# Patient Record
Sex: Female | Born: 1955 | Race: White | Hispanic: No | Smoking: Former smoker
Health system: Southern US, Community
[De-identification: ages and names within clinical notes are randomized; demographics above are authoritative.]

## PROBLEM LIST (undated history)

## (undated) DIAGNOSIS — J449 Chronic obstructive pulmonary disease, unspecified: Secondary | ICD-10-CM

## (undated) DIAGNOSIS — Z923 Personal history of irradiation: Secondary | ICD-10-CM

## (undated) DIAGNOSIS — C50919 Malignant neoplasm of unspecified site of unspecified female breast: Secondary | ICD-10-CM

## (undated) DIAGNOSIS — Z9221 Personal history of antineoplastic chemotherapy: Secondary | ICD-10-CM

## (undated) DIAGNOSIS — R7303 Prediabetes: Secondary | ICD-10-CM

## (undated) DIAGNOSIS — E039 Hypothyroidism, unspecified: Secondary | ICD-10-CM

## (undated) DIAGNOSIS — I10 Essential (primary) hypertension: Secondary | ICD-10-CM

## (undated) HISTORY — PX: BREAST LUMPECTOMY: SHX2

---

## 2001-05-03 ENCOUNTER — Emergency Department (HOSPITAL_COMMUNITY): Admission: EM | Admit: 2001-05-03 | Discharge: 2001-05-04 | Payer: Self-pay | Admitting: Emergency Medicine

## 2001-05-04 ENCOUNTER — Encounter: Payer: Self-pay | Admitting: Emergency Medicine

## 2005-12-09 DIAGNOSIS — C50919 Malignant neoplasm of unspecified site of unspecified female breast: Secondary | ICD-10-CM

## 2005-12-09 HISTORY — PX: BREAST BIOPSY: SHX20

## 2005-12-09 HISTORY — DX: Malignant neoplasm of unspecified site of unspecified female breast: C50.919

## 2005-12-23 ENCOUNTER — Ambulatory Visit: Payer: Self-pay | Admitting: Obstetrics and Gynecology

## 2005-12-23 ENCOUNTER — Ambulatory Visit: Payer: Self-pay | Admitting: Internal Medicine

## 2006-01-03 ENCOUNTER — Ambulatory Visit: Payer: Self-pay | Admitting: General Surgery

## 2006-01-23 ENCOUNTER — Ambulatory Visit: Payer: Self-pay | Admitting: Internal Medicine

## 2006-01-27 ENCOUNTER — Ambulatory Visit: Payer: Self-pay | Admitting: General Surgery

## 2006-02-06 ENCOUNTER — Ambulatory Visit: Payer: Self-pay | Admitting: Internal Medicine

## 2006-03-09 ENCOUNTER — Ambulatory Visit: Payer: Self-pay | Admitting: Internal Medicine

## 2006-04-08 ENCOUNTER — Ambulatory Visit: Payer: Self-pay | Admitting: Internal Medicine

## 2006-05-09 ENCOUNTER — Ambulatory Visit: Payer: Self-pay | Admitting: Internal Medicine

## 2006-06-08 ENCOUNTER — Ambulatory Visit: Payer: Self-pay | Admitting: Internal Medicine

## 2006-07-09 ENCOUNTER — Ambulatory Visit: Payer: Self-pay | Admitting: Internal Medicine

## 2006-08-09 ENCOUNTER — Ambulatory Visit: Payer: Self-pay | Admitting: Internal Medicine

## 2006-09-08 ENCOUNTER — Ambulatory Visit: Payer: Self-pay | Admitting: Internal Medicine

## 2006-10-09 ENCOUNTER — Ambulatory Visit: Payer: Self-pay | Admitting: Internal Medicine

## 2007-01-13 ENCOUNTER — Ambulatory Visit: Payer: Self-pay | Admitting: Internal Medicine

## 2007-02-07 ENCOUNTER — Ambulatory Visit: Payer: Self-pay | Admitting: Internal Medicine

## 2007-04-09 ENCOUNTER — Ambulatory Visit: Payer: Self-pay | Admitting: Internal Medicine

## 2007-04-13 ENCOUNTER — Ambulatory Visit: Payer: Self-pay | Admitting: Internal Medicine

## 2007-05-10 ENCOUNTER — Ambulatory Visit: Payer: Self-pay | Admitting: Internal Medicine

## 2007-06-09 ENCOUNTER — Ambulatory Visit: Payer: Self-pay | Admitting: Internal Medicine

## 2007-08-10 ENCOUNTER — Ambulatory Visit: Payer: Self-pay | Admitting: Internal Medicine

## 2007-08-12 ENCOUNTER — Ambulatory Visit: Payer: Self-pay | Admitting: Internal Medicine

## 2007-09-09 ENCOUNTER — Ambulatory Visit: Payer: Self-pay | Admitting: Internal Medicine

## 2008-04-06 ENCOUNTER — Ambulatory Visit: Payer: Self-pay | Admitting: Internal Medicine

## 2008-05-09 ENCOUNTER — Ambulatory Visit: Payer: Self-pay | Admitting: Internal Medicine

## 2008-08-19 ENCOUNTER — Emergency Department: Payer: Self-pay | Admitting: Emergency Medicine

## 2008-10-09 ENCOUNTER — Ambulatory Visit: Payer: Self-pay | Admitting: Internal Medicine

## 2008-10-28 ENCOUNTER — Ambulatory Visit: Payer: Self-pay | Admitting: Internal Medicine

## 2008-11-08 ENCOUNTER — Ambulatory Visit: Payer: Self-pay | Admitting: Internal Medicine

## 2009-04-08 ENCOUNTER — Ambulatory Visit: Payer: Self-pay | Admitting: Internal Medicine

## 2009-04-26 ENCOUNTER — Ambulatory Visit: Payer: Self-pay | Admitting: Internal Medicine

## 2009-05-09 ENCOUNTER — Ambulatory Visit: Payer: Self-pay | Admitting: Internal Medicine

## 2009-06-08 ENCOUNTER — Ambulatory Visit: Payer: Self-pay | Admitting: Internal Medicine

## 2009-10-09 ENCOUNTER — Ambulatory Visit: Payer: Self-pay | Admitting: Internal Medicine

## 2009-10-16 ENCOUNTER — Ambulatory Visit: Payer: Self-pay | Admitting: Internal Medicine

## 2009-11-08 ENCOUNTER — Ambulatory Visit: Payer: Self-pay | Admitting: Internal Medicine

## 2010-04-08 ENCOUNTER — Ambulatory Visit: Payer: Self-pay | Admitting: Internal Medicine

## 2010-04-16 ENCOUNTER — Ambulatory Visit: Payer: Self-pay | Admitting: Internal Medicine

## 2010-05-09 ENCOUNTER — Ambulatory Visit: Payer: Self-pay | Admitting: Internal Medicine

## 2010-06-08 ENCOUNTER — Ambulatory Visit: Payer: Self-pay | Admitting: Internal Medicine

## 2010-11-09 ENCOUNTER — Ambulatory Visit: Payer: Self-pay | Admitting: Gastroenterology

## 2010-11-14 LAB — PATHOLOGY REPORT

## 2011-05-10 ENCOUNTER — Ambulatory Visit: Payer: Self-pay | Admitting: Internal Medicine

## 2011-06-09 ENCOUNTER — Ambulatory Visit: Payer: Self-pay | Admitting: Internal Medicine

## 2011-06-11 ENCOUNTER — Emergency Department: Payer: Self-pay | Admitting: Unknown Physician Specialty

## 2012-08-05 ENCOUNTER — Ambulatory Visit: Payer: Self-pay | Admitting: Family Medicine

## 2013-09-21 ENCOUNTER — Ambulatory Visit: Payer: Self-pay | Admitting: Family Medicine

## 2014-11-11 ENCOUNTER — Ambulatory Visit: Payer: Self-pay | Admitting: Family Medicine

## 2015-10-27 ENCOUNTER — Other Ambulatory Visit: Payer: Self-pay | Admitting: Family Medicine

## 2015-10-27 DIAGNOSIS — Z1231 Encounter for screening mammogram for malignant neoplasm of breast: Secondary | ICD-10-CM

## 2015-11-14 ENCOUNTER — Ambulatory Visit: Payer: Self-pay

## 2015-12-12 ENCOUNTER — Ambulatory Visit: Payer: Self-pay | Attending: Family Medicine

## 2016-01-23 ENCOUNTER — Other Ambulatory Visit: Payer: Self-pay | Admitting: Family Medicine

## 2016-01-23 DIAGNOSIS — R911 Solitary pulmonary nodule: Secondary | ICD-10-CM

## 2016-01-26 ENCOUNTER — Ambulatory Visit: Payer: Self-pay

## 2016-01-29 ENCOUNTER — Ambulatory Visit
Admission: RE | Admit: 2016-01-29 | Discharge: 2016-01-29 | Disposition: A | Discharge status: Home / Self Care | Payer: Managed Care, Other (non HMO) | Source: Ambulatory Visit | Attending: Family Medicine | Admitting: Family Medicine

## 2016-01-29 DIAGNOSIS — R911 Solitary pulmonary nodule: Secondary | ICD-10-CM

## 2016-01-29 DIAGNOSIS — R918 Other nonspecific abnormal finding of lung field: Secondary | ICD-10-CM | POA: Insufficient documentation

## 2016-06-17 ENCOUNTER — Ambulatory Visit
Admission: RE | Admit: 2016-06-17 | Discharge: 2016-06-17 | Disposition: A | Discharge status: Home / Self Care | Payer: Managed Care, Other (non HMO) | Source: Ambulatory Visit | Attending: Family Medicine | Admitting: Family Medicine

## 2016-06-17 ENCOUNTER — Other Ambulatory Visit: Payer: Self-pay | Admitting: Family Medicine

## 2016-06-17 DIAGNOSIS — Z1231 Encounter for screening mammogram for malignant neoplasm of breast: Secondary | ICD-10-CM

## 2016-06-17 HISTORY — DX: Malignant neoplasm of unspecified site of unspecified female breast: C50.919

## 2018-06-10 DIAGNOSIS — I10 Essential (primary) hypertension: Secondary | ICD-10-CM | POA: Insufficient documentation

## 2018-10-06 ENCOUNTER — Other Ambulatory Visit: Payer: Self-pay | Admitting: Family Medicine

## 2018-10-06 DIAGNOSIS — Z1231 Encounter for screening mammogram for malignant neoplasm of breast: Secondary | ICD-10-CM

## 2018-11-02 ENCOUNTER — Ambulatory Visit
Admission: RE | Admit: 2018-11-02 | Discharge: 2018-11-02 | Disposition: A | Discharge status: Home / Self Care | Payer: Managed Care, Other (non HMO) | Source: Ambulatory Visit | Attending: Family Medicine | Admitting: Family Medicine

## 2018-11-02 DIAGNOSIS — Z1231 Encounter for screening mammogram for malignant neoplasm of breast: Secondary | ICD-10-CM | POA: Diagnosis present

## 2019-08-24 ENCOUNTER — Other Ambulatory Visit: Payer: Self-pay | Admitting: Family Medicine

## 2019-12-14 ENCOUNTER — Other Ambulatory Visit: Payer: Self-pay | Admitting: Family Medicine

## 2019-12-14 DIAGNOSIS — Z1231 Encounter for screening mammogram for malignant neoplasm of breast: Secondary | ICD-10-CM

## 2020-01-12 ENCOUNTER — Ambulatory Visit
Admission: RE | Admit: 2020-01-12 | Discharge: 2020-01-12 | Disposition: A | Discharge status: Home / Self Care | Payer: No Typology Code available for payment source | Source: Ambulatory Visit | Attending: Family Medicine | Admitting: Family Medicine

## 2020-01-12 DIAGNOSIS — Z1231 Encounter for screening mammogram for malignant neoplasm of breast: Secondary | ICD-10-CM | POA: Diagnosis not present

## 2020-01-12 HISTORY — DX: Personal history of irradiation: Z92.3

## 2020-01-12 HISTORY — DX: Personal history of antineoplastic chemotherapy: Z92.21

## 2020-01-18 ENCOUNTER — Other Ambulatory Visit: Payer: Self-pay | Admitting: Family Medicine

## 2020-01-18 DIAGNOSIS — R921 Mammographic calcification found on diagnostic imaging of breast: Secondary | ICD-10-CM

## 2020-01-18 DIAGNOSIS — R928 Other abnormal and inconclusive findings on diagnostic imaging of breast: Secondary | ICD-10-CM

## 2020-01-27 ENCOUNTER — Ambulatory Visit: Payer: No Typology Code available for payment source

## 2020-02-03 ENCOUNTER — Other Ambulatory Visit: Payer: Self-pay | Admitting: Family Medicine

## 2020-02-03 ENCOUNTER — Ambulatory Visit
Admission: RE | Admit: 2020-02-03 | Discharge: 2020-02-03 | Disposition: A | Discharge status: Home / Self Care | Payer: PRIVATE HEALTH INSURANCE | Source: Ambulatory Visit | Attending: Family Medicine | Admitting: Family Medicine

## 2020-02-03 DIAGNOSIS — R928 Other abnormal and inconclusive findings on diagnostic imaging of breast: Secondary | ICD-10-CM

## 2020-02-03 DIAGNOSIS — R921 Mammographic calcification found on diagnostic imaging of breast: Secondary | ICD-10-CM | POA: Diagnosis present

## 2020-02-07 ENCOUNTER — Other Ambulatory Visit: Payer: Self-pay | Admitting: Family Medicine

## 2020-02-07 DIAGNOSIS — R928 Other abnormal and inconclusive findings on diagnostic imaging of breast: Secondary | ICD-10-CM

## 2020-02-07 DIAGNOSIS — R921 Mammographic calcification found on diagnostic imaging of breast: Secondary | ICD-10-CM

## 2020-02-14 ENCOUNTER — Ambulatory Visit
Admission: RE | Admit: 2020-02-14 | Discharge: 2020-02-14 | Disposition: A | Discharge status: Home / Self Care | Payer: PRIVATE HEALTH INSURANCE | Source: Ambulatory Visit | Attending: Family Medicine | Admitting: Family Medicine

## 2020-02-14 DIAGNOSIS — R928 Other abnormal and inconclusive findings on diagnostic imaging of breast: Secondary | ICD-10-CM | POA: Insufficient documentation

## 2020-02-14 DIAGNOSIS — R921 Mammographic calcification found on diagnostic imaging of breast: Secondary | ICD-10-CM

## 2020-02-14 HISTORY — PX: BREAST BIOPSY: SHX20

## 2020-02-15 ENCOUNTER — Other Ambulatory Visit: Payer: Self-pay | Admitting: Anatomic Pathology & Clinical Pathology

## 2020-02-16 ENCOUNTER — Encounter: Payer: Self-pay | Admitting: *Deleted

## 2020-02-16 NOTE — Progress Notes (Signed)
Called patient to introduce her to navigation services.  She is newly diagnosed with DCIS of the left breast.  Patient has a history of right breast cancer diagnosed in 2007.  At that time she saw Dr. Sharlet Salina and Dr. Ma Hillock. She had chemotherapy, radiation therapy and antihormonal therapy.  She would like to go back to [facility] for surgical consultation.  I have scheduled her an appointment to see Dr. Peyton Najjar on 02/17/20 @ 2:30.  She understands that I will work on getting her a medical oncology consult appointment tomorrow and will get back to her.  She is to pick up her patient breast cancer educational literature, "My Breast Cancer Treatment Handbook" by Josephine Igo, RN at the appointment tomorrow.  She is to call with any questions or needs.

## 2020-02-17 ENCOUNTER — Other Ambulatory Visit: Payer: Self-pay | Admitting: *Deleted

## 2020-02-17 ENCOUNTER — Encounter: Payer: Self-pay | Admitting: *Deleted

## 2020-02-17 DIAGNOSIS — D0512 Intraductal carcinoma in situ of left breast: Secondary | ICD-10-CM

## 2020-02-17 NOTE — Progress Notes (Signed)
Called and informed patient of consult with Dr. Janese Banks on Tuesday 02/22/20 @ 3:00.

## 2020-02-22 ENCOUNTER — Encounter: Payer: Self-pay | Admitting: Oncology

## 2020-02-22 ENCOUNTER — Inpatient Hospital Stay: Payer: PRIVATE HEALTH INSURANCE | Attending: Oncology | Admitting: Oncology

## 2020-02-22 ENCOUNTER — Other Ambulatory Visit: Payer: Self-pay

## 2020-02-22 VITALS — BP 110/78 | HR 82 | Temp 98.3°F | Ht 71.0 in | Wt 203.0 lb

## 2020-02-22 DIAGNOSIS — E7800 Pure hypercholesterolemia, unspecified: Secondary | ICD-10-CM | POA: Insufficient documentation

## 2020-02-22 DIAGNOSIS — Z853 Personal history of malignant neoplasm of breast: Secondary | ICD-10-CM | POA: Insufficient documentation

## 2020-02-22 DIAGNOSIS — E039 Hypothyroidism, unspecified: Secondary | ICD-10-CM | POA: Insufficient documentation

## 2020-02-22 DIAGNOSIS — R7303 Prediabetes: Secondary | ICD-10-CM | POA: Insufficient documentation

## 2020-02-22 DIAGNOSIS — D0512 Intraductal carcinoma in situ of left breast: Secondary | ICD-10-CM | POA: Diagnosis present

## 2020-02-22 DIAGNOSIS — F17200 Nicotine dependence, unspecified, uncomplicated: Secondary | ICD-10-CM | POA: Insufficient documentation

## 2020-02-22 DIAGNOSIS — E6609 Other obesity due to excess calories: Secondary | ICD-10-CM | POA: Insufficient documentation

## 2020-02-22 DIAGNOSIS — Z7189 Other specified counseling: Secondary | ICD-10-CM

## 2020-02-22 DIAGNOSIS — J449 Chronic obstructive pulmonary disease, unspecified: Secondary | ICD-10-CM | POA: Insufficient documentation

## 2020-02-22 NOTE — Progress Notes (Signed)
Patient is here today to establish care for DCIS on her left breast.

## 2020-02-23 ENCOUNTER — Telehealth: Payer: Self-pay | Admitting: *Deleted

## 2020-02-23 ENCOUNTER — Ambulatory Visit: Payer: Self-pay | Admitting: General Surgery

## 2020-02-23 NOTE — H&P (Signed)
PATIENT PROFILE: Margaret Douglas is a 64 y.o. female who presents to the Clinic for consultation at the request of Dr. Netty Douglas for evaluation of left breast DCIS.  PCP:  Margaret Body, MD  HISTORY OF PRESENT ILLNESS: Margaret Douglas reports she had her usual screening mammogram and she was found with an area of concern.  This was not able to be identified on ultrasound.  Diagnostic mammogram was done showing the area of the left breast.  Stereotactic core needle biopsy was done.  This showed DCIS grade 2-3, with comedonecrosis.  Patient denies any palpable mass, denies any skin changes, denies any nipple discharge or retraction.  Patient has past medical history of right breast cancer with positive lymph nodes.  She received chemotherapy, partial mastectomy, radiation therapy.  She completed antiestrogen therapy.  Patient today very frustrated by the situation.  Patient was oriented about the new findings of new breast cancer of the contralateral breast.  PROBLEM LIST:         Problem List  Date Reviewed: 01/21/2020         Noted   Essential hypertension 06/10/2018   Borderline diabetes mellitus (A1c 6.2% - 01/21/20) - diet controlled Unknown   Tobacco dependence (1/2 ppd - started at age 50) Unknown   COPD (chronic obstructive pulmonary disease) , unspecified (CMS-HCC) Unknown   Acquired hypothyroidism (TSH 0.4 - 01/21/20) Unknown   History of breast cancer Unknown   Non morbid obesity due to excess calories Unknown   Pure hypercholesterolemia (LDL 99 - 01/21/20)  Unknown      GENERAL REVIEW OF SYSTEMS:   General ROS: negative for - chills, fatigue, fever, weight gain or weight loss Allergy and Immunology ROS: negative for - hives  Hematological and Lymphatic ROS: negative for - bleeding problems or bruising, negative for palpable nodes Endocrine ROS: negative for - heat or cold intolerance, hair changes Respiratory ROS: negative for - cough, shortness of breath or  wheezing Cardiovascular ROS: no chest pain or palpitations GI ROS: negative for nausea, vomiting, abdominal pain, diarrhea, constipation Musculoskeletal ROS: negative for - joint swelling or muscle pain Neurological ROS: negative for - confusion, syncope Dermatological ROS: negative for pruritus and rash Psychiatric: negative for anxiety, depression, difficulty sleeping and memory loss  MEDICATIONS: Current Medications        Current Outpatient Medications  Medication Sig Dispense Refill  . amLODIPine (NORVASC) 5 MG tablet Take 1 tablet (5 mg total) by mouth once daily 30 tablet 11  . atorvastatin (LIPITOR) 40 MG tablet TAKE 1 TABLET BY MOUTH AT  NIGHT 90 tablet 3  . levothyroxine (SYNTHROID) 150 MCG tablet TAKE 1 TABLET BY MOUTH ONCE DAILY ON AN EMPTY STOMACH  WITH A GLASS OF WATER AT  LEAST 30 TO 60 MINUTES  BEFORE BREAKFAST 90 tablet 3   No current facility-administered medications for this visit.       ALLERGIES: Patient has no known allergies.  PAST MEDICAL HISTORY:     Past Medical History:  Diagnosis Date  . Borderline diabetes   . Breast cancer (CMS-HCC)   . COPD (chronic obstructive pulmonary disease) (CMS-HCC)   . History of breast cancer   . HLD (hyperlipidemia)   . Hypothyroidism   . Obesity   . Tobacco dependence     PAST SURGICAL HISTORY: Past Surgical History:  Procedure Laterality Date  . Right breast lumpectomy    . Right leg surery s/p fracture       FAMILY HISTORY:  Family History  Problem Relation Age of Onset  . No Known Problems Father   . No Known Problems Mother   . No Known Problems Sister   . No Known Problems Sister      SOCIAL HISTORY: Social History          Socioeconomic History  . Marital status: Divorced    Spouse name: Not on file  . Number of children: Not on file  . Years of education: Not on file  . Highest education level: Not on file  Occupational History  . Not on file  Social  Needs  . Financial resource strain: Not on file  . Food insecurity    Worry: Not on file    Inability: Not on file  . Transportation needs    Medical: Not on file    Non-medical: Not on file  Tobacco Use  . Smoking status: Current Every Day Smoker    Packs/day: 0.50    Years: 39.00    Pack years: 19.50    Types: Cigarettes  . Smokeless tobacco: Never Used  Substance and Sexual Activity  . Alcohol use: Yes    Alcohol/week: 0.0 standard drinks    Comment: Occasional use  . Drug use: No  . Sexual activity: Defer  Other Topics Concern  . 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 Weight: 95.3 kg (210 lb)   GENERAL: Alert, active, oriented x3  HEENT: Pupils equal reactive to light. Extraocular movements are intact. Sclera clear. Palpebral conjunctiva normal red color.Pharynx clear.  NECK: Supple with no palpable mass and no adenopathy.  LUNGS: Sound clear with no rales rhonchi or wheezes.  HEART: Regular rhythm S1 and S2 without murmur.  BREAST: breasts appear normal, no suspicious masses, no skin or nipple changes or axillary nodes.  ABDOMEN: Soft and depressible, nontender with no palpable mass, no hepatomegaly.  EXTREMITIES: Well-developed well-nourished symmetrical with no dependent edema.  NEUROLOGICAL: Awake alert oriented, facial expression symmetrical, moving all extremities.  REVIEW OF DATA: I have reviewed the following data today:      Office Visit on 01/21/2020  Component Date Value  . Thyroid Stimulating Horm* 01/21/2020 0.424*  . Cholesterol, Total 01/21/2020 198   . Triglyceride 01/21/2020 62   . HDL (High Density Lipopr* 01/21/2020  86.9*  . LDL Calculated 01/21/2020 99   . VLDL Cholesterol 01/21/2020 12   . Cholesterol/HDL Ratio 01/21/2020 2.3   . Hemoglobin A1C 01/21/2020 6.2*  . Average Blood Glucose (C* 01/21/2020 131   Appointment on 01/17/2020  Component Date Value  . Glucose 01/17/2020 91   . Sodium 01/17/2020 141   . Potassium 01/17/2020 3.8   . Chloride 01/17/2020 105   . Carbon Dioxide (CO2) 01/17/2020 26.7   . Urea Nitrogen (BUN) 01/17/2020 16   . Creatinine 01/17/2020 0.6   . Glomerular Filtration Ra* 01/17/2020 101   . Calcium 01/17/2020 9.7   . AST  01/17/2020 24   . ALT  01/17/2020 20   . Alk Phos (alkaline Phosp* 01/17/2020 94   . Albumin 01/17/2020 4.3   . Bilirubin, Total 01/17/2020 0.7   . Protein, Total 01/17/2020  6.5   . A/G Ratio 01/17/2020 2.0      ASSESSMENT: Ms. Bloodworth is a 64 y.o. female presenting for consultation for left breast DCIS.    Patient was oriented again about the pathology results. Surgical alternatives were discussed with patient including partial vs total mastectomy. Surgical technique and post operative care was discussed with patient. Risk of surgery was discussed with patient including but not limited to: wound infection, seroma, hematoma, brachial plexopathy, mondor's disease (thrombosis of small veins of breast), chronic wound pain, breast lymphedema, altered sensation to the nipple and cosmesis among others.   Due to the patient size of the DCIS my recommendation is to proceed with partial mastectomy, needle guided.  Due to the finding of comedonecrosis is usually offered the patient the alternative of having sentinel node biopsy during the initial surgery in case these get upgraded to invasive carcinoma to avoid to come back for a second surgery.   Ductal carcinoma in situ (DCIS) of left breast [D05.12]  PLAN: 1.Left breast pre operative radiological marker guided partial mastectomy with sentinel lymph node biopsy.   Patient verbalized understanding,  all questions were answered, and were agreeable with the plan outlined above.   I spent a total of 60 minutes in both face-to-face and non-face-to-face activities for this visit on the date of this encounter.  Herbert Pun, MD  Electronically signed by Herbert Pun, MD

## 2020-02-23 NOTE — H&P (View-Only) (Signed)
PATIENT PROFILE: Gerilyn Stargell is a 64 y.o. female who presents to the Clinic for consultation at the request of Dr. Netty Starring for evaluation of left breast DCIS.  PCP:  Dion Body, MD  HISTORY OF PRESENT ILLNESS: [name] reports she had her usual screening mammogram and she was found with an area of concern.  This was not able to be identified on ultrasound.  Diagnostic mammogram was done showing the area of the left breast.  Stereotactic core needle biopsy was done.  This showed DCIS grade 2-3, with comedonecrosis.  Patient denies any palpable mass, denies any skin changes, denies any nipple discharge or retraction.  Patient has past medical history of right breast cancer with positive lymph nodes.  She received chemotherapy, partial mastectomy, radiation therapy.  She completed antiestrogen therapy.  Patient today very frustrated by the situation.  Patient was oriented about the new findings of new breast cancer of the contralateral breast.  PROBLEM LIST:         Problem List  Date Reviewed: 01/21/2020         Noted   Essential hypertension 06/10/2018   Borderline diabetes mellitus (A1c 6.2% - 01/21/20) - diet controlled Unknown   Tobacco dependence (1/2 ppd - started at age 50) Unknown   COPD (chronic obstructive pulmonary disease) , unspecified (CMS-HCC) Unknown   Acquired hypothyroidism (TSH 0.4 - 01/21/20) Unknown   History of breast cancer Unknown   Non morbid obesity due to excess calories Unknown   Pure hypercholesterolemia (LDL 99 - 01/21/20)  Unknown      GENERAL REVIEW OF SYSTEMS:   General ROS: negative for - chills, fatigue, fever, weight gain or weight loss Allergy and Immunology ROS: negative for - hives  Hematological and Lymphatic ROS: negative for - bleeding problems or bruising, negative for palpable nodes Endocrine ROS: negative for - heat or cold intolerance, hair changes Respiratory ROS: negative for - cough, shortness of breath or  wheezing Cardiovascular ROS: no chest pain or palpitations GI ROS: negative for nausea, vomiting, abdominal pain, diarrhea, constipation Musculoskeletal ROS: negative for - joint swelling or muscle pain Neurological ROS: negative for - confusion, syncope Dermatological ROS: negative for pruritus and rash Psychiatric: negative for anxiety, depression, difficulty sleeping and memory loss  MEDICATIONS: Current Medications        Current Outpatient Medications  Medication Sig Dispense Refill  . amLODIPine (NORVASC) 5 MG tablet Take 1 tablet (5 mg total) by mouth once daily 30 tablet 11  . atorvastatin (LIPITOR) 40 MG tablet TAKE 1 TABLET BY MOUTH AT  NIGHT 90 tablet 3  . levothyroxine (SYNTHROID) 150 MCG tablet TAKE 1 TABLET BY MOUTH ONCE DAILY ON AN EMPTY STOMACH  WITH A GLASS OF WATER AT  LEAST 30 TO 60 MINUTES  BEFORE BREAKFAST 90 tablet 3   No current facility-administered medications for this visit.       ALLERGIES: Patient has no known allergies.  PAST MEDICAL HISTORY:     Past Medical History:  Diagnosis Date  . Borderline diabetes   . Breast cancer (CMS-HCC)   . COPD (chronic obstructive pulmonary disease) (CMS-HCC)   . History of breast cancer   . HLD (hyperlipidemia)   . Hypothyroidism   . Obesity   . Tobacco dependence     PAST SURGICAL HISTORY: Past Surgical History:  Procedure Laterality Date  . Right breast lumpectomy    . Right leg surery s/p fracture       FAMILY HISTORY:  Family History  Problem Relation Age of Onset  . No Known Problems Father   . No Known Problems Mother   . No Known Problems Sister   . No Known Problems Sister      SOCIAL HISTORY: Social History          Socioeconomic History  . Marital status: Divorced    Spouse name: Not on file  . Number of children: Not on file  . Years of education: Not on file  . Highest education level: Not on file  Occupational History  . Not on file  Social  Needs  . Financial resource strain: Not on file  . Food insecurity    Worry: Not on file    Inability: Not on file  . Transportation needs    Medical: Not on file    Non-medical: Not on file  Tobacco Use  . Smoking status: Current Every Day Smoker    Packs/day: 0.50    Years: 39.00    Pack years: 19.50    Types: Cigarettes  . Smokeless tobacco: Never Used  Substance and Sexual Activity  . Alcohol use: Yes    Alcohol/week: 0.0 standard drinks    Comment: Occasional use  . Drug use: No  . Sexual activity: Defer  Other Topics Concern  . 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 Weight: 95.3 kg (210 lb)   GENERAL: Alert, active, oriented x3  HEENT: Pupils equal reactive to light. Extraocular movements are intact. Sclera clear. Palpebral conjunctiva normal red color.Pharynx clear.  NECK: Supple with no palpable mass and no adenopathy.  LUNGS: Sound clear with no rales rhonchi or wheezes.  HEART: Regular rhythm S1 and S2 without murmur.  BREAST: breasts appear normal, no suspicious masses, no skin or nipple changes or axillary nodes.  ABDOMEN: Soft and depressible, nontender with no palpable mass, no hepatomegaly.  EXTREMITIES: Well-developed well-nourished symmetrical with no dependent edema.  NEUROLOGICAL: Awake alert oriented, facial expression symmetrical, moving all extremities.  REVIEW OF DATA: I have reviewed the following data today:      Office Visit on 01/21/2020  Component Date Value  . Thyroid Stimulating Horm* 01/21/2020 0.424*  . Cholesterol, Total 01/21/2020 198   . Triglyceride 01/21/2020 62   . HDL (High Density Lipopr* 01/21/2020  86.9*  . LDL Calculated 01/21/2020 99   . VLDL Cholesterol 01/21/2020 12   . Cholesterol/HDL Ratio 01/21/2020 2.3   . Hemoglobin A1C 01/21/2020 6.2*  . Average Blood Glucose (C* 01/21/2020 131   Appointment on 01/17/2020  Component Date Value  . Glucose 01/17/2020 91   . Sodium 01/17/2020 141   . Potassium 01/17/2020 3.8   . Chloride 01/17/2020 105   . Carbon Dioxide (CO2) 01/17/2020 26.7   . Urea Nitrogen (BUN) 01/17/2020 16   . Creatinine 01/17/2020 0.6   . Glomerular Filtration Ra* 01/17/2020 101   . Calcium 01/17/2020 9.7   . AST  01/17/2020 24   . ALT  01/17/2020 20   . Alk Phos (alkaline Phosp* 01/17/2020 94   . Albumin 01/17/2020 4.3   . Bilirubin, Total 01/17/2020 0.7   . Protein, Total 01/17/2020   6.5   . A/G Ratio 01/17/2020 2.0      ASSESSMENT: Ms. Bloodworth is a 64 y.o. female presenting for consultation for left breast DCIS.    Patient was oriented again about the pathology results. Surgical alternatives were discussed with patient including partial vs total mastectomy. Surgical technique and post operative care was discussed with patient. Risk of surgery was discussed with patient including but not limited to: wound infection, seroma, hematoma, brachial plexopathy, mondor's disease (thrombosis of small veins of breast), chronic wound pain, breast lymphedema, altered sensation to the nipple and cosmesis among others.   Due to the patient size of the DCIS my recommendation is to proceed with partial mastectomy, needle guided.  Due to the finding of comedonecrosis is usually offered the patient the alternative of having sentinel node biopsy during the initial surgery in case these get upgraded to invasive carcinoma to avoid to come back for a second surgery.   Ductal carcinoma in situ (DCIS) of left breast [D05.12]  PLAN: 1.Left breast pre operative radiological marker guided partial mastectomy with sentinel lymph node biopsy.   Patient verbalized understanding,  all questions were answered, and were agreeable with the plan outlined above.   I spent a total of 60 minutes in both face-to-face and non-face-to-face activities for this visit on the date of this encounter.  Herbert Pun, MD  Electronically signed by Herbert Pun, MD

## 2020-02-23 NOTE — Telephone Encounter (Signed)
Patient called to report that she has her surgery scheduled (per apt desk it is 3/29 also she said she used to see Dr Ma Hillock in past. She has a question regarding her April appointment since she is not having surgery until late March, if she should change that or not. She requests a return call [phone]

## 2020-02-24 ENCOUNTER — Other Ambulatory Visit: Payer: Self-pay | Admitting: General Surgery

## 2020-02-24 DIAGNOSIS — C50912 Malignant neoplasm of unspecified site of left female breast: Secondary | ICD-10-CM

## 2020-02-24 DIAGNOSIS — D0512 Intraductal carcinoma in situ of left breast: Secondary | ICD-10-CM

## 2020-02-24 NOTE — Telephone Encounter (Signed)
Call returned and left message on her voice mail that per Dr Janese Banks she should keep her 4/9 appointment as scheduled and to call back for any questions

## 2020-02-24 NOTE — Telephone Encounter (Signed)
Please let her know her pathology will be back by 4/9. So she should keep her appt as is.

## 2020-02-25 ENCOUNTER — Telehealth: Payer: Self-pay | Admitting: *Deleted

## 2020-02-25 NOTE — Telephone Encounter (Signed)
Pt wanted a call back about how many surgeries pt will have to go through. Dr. Janese Banks spoe to Dr. Peyton Najjar and between the both of them they both feel that she will only need 1 surgery and that will be to take cancer out and a sample of lymph node.  The only reason they see that you could possibly have second surgery if they do not get clear margins. That means that when he takes the tumor out then it goes to pathology to see if they have 44mm of tissue around the cancer area free from any cancer cells. She is glad that they feel very confident about 1 surgery.

## 2020-02-26 ENCOUNTER — Encounter: Payer: Self-pay | Admitting: Oncology

## 2020-02-26 DIAGNOSIS — Z7189 Other specified counseling: Secondary | ICD-10-CM | POA: Insufficient documentation

## 2020-02-26 DIAGNOSIS — D0512 Intraductal carcinoma in situ of left breast: Secondary | ICD-10-CM | POA: Insufficient documentation

## 2020-02-26 NOTE — Progress Notes (Signed)
Hematology/Oncology Consult note [facility] Telephone:([phone] [phone]  Patient Care Team: Dion Body, MD as PCP - General (Family Medicine) Theodore Demark, RN as Registered Nurse Rico Junker, RN as Registered Nurse   Name of the patient: Margaret Douglas  [id number]  May 20, 1956    Reason for referral-new diagnosis of left breast DCIS   Referring physician-Dr. Netty Starring  Date of visit: 02/26/20   History of presenting illness- Patient is a 64 year old female with prior history of her right breast cancer stage III that was treated with lumpectomy and adjuvant radiation treatment and chemotherapy back in 2017.  She also received 5 years of antiestrogen therapy.  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  Patient has met with Dr. Peyton Najjar and plan is to proceed with lumpectomy and sentinel lymph node biopsy.  No family history of breast cancer, colon cancer, Gastric cancer melanoma ovarian cancer.  ECOG PS- 0     Review of systems- Review of Systems  Constitutional: Negative for chills, fever, malaise/fatigue and weight loss.  HENT: Negative for congestion, ear discharge and nosebleeds.   Eyes: Negative for blurred vision.  Respiratory: Negative for cough, hemoptysis, sputum production, shortness of breath and wheezing.   Cardiovascular: Negative for chest pain, palpitations, orthopnea and claudication.  Gastrointestinal: Negative for abdominal pain, blood in stool, constipation, diarrhea, heartburn, melena, nausea and vomiting.  Genitourinary: Negative for dysuria, flank pain, frequency, hematuria and urgency.  Musculoskeletal: Negative for back pain, joint pain and myalgias.  Skin: Negative for rash.  Neurological: Negative for dizziness, tingling, focal weakness, seizures, weakness and  headaches.  Endo/Heme/Allergies: Does not bruise/bleed easily.  Psychiatric/Behavioral: Negative for depression and suicidal ideas. The patient does not have insomnia.     No Known Allergies  Patient Active Problem List   Diagnosis Date Noted  . Goals of care, counseling/discussion 02/26/2020  . Ductal carcinoma in situ (DCIS) of left breast 02/26/2020  . Acquired hypothyroidism 02/22/2020  . Borderline diabetes mellitus 02/22/2020  . COPD (chronic obstructive pulmonary disease) ([place]) 02/22/2020  . History of breast cancer 02/22/2020  . Non morbid obesity due to excess calories 02/22/2020  . Pure hypercholesterolemia 02/22/2020  . Tobacco dependence 02/22/2020  . Essential hypertension 06/10/2018     Past Medical History:  Diagnosis Date  . Breast cancer ([place]) 2007   RT LUMPECTOMY  . Personal history of chemotherapy   . Personal history of radiation therapy      Past Surgical History:  Procedure Laterality Date  . BREAST BIOPSY Right 2007   breast ca rad and chemo  . BREAST BIOPSY Left 02/14/2020   Affirm bx-"x" clip-path pending  . BREAST LUMPECTOMY      Social History   Socioeconomic History  . Marital status: Divorced    Spouse name: Not on file  . Number of children: Not on file  . Years of education: Not on file  . Highest education level: Not on file  Occupational History  . Not on file  Tobacco Use  . Smoking status: Current Every Day Smoker    Packs/day: 0.50    Years: 35.00    Pack years: 17.50    Types: Cigarettes  . Smokeless tobacco: Never Used  Substance and Sexual Activity  . Alcohol use: Yes    Alcohol/week: 1.0 standard drinks    Types: 1 Standard drinks or equivalent per  week    Comment: daily  . Drug use: Not Currently  . Sexual activity: Not on file  Other Topics Concern  . Not on file  Social History Narrative  . Not on file   Social Determinants of Health   Financial Resource Strain:   . Difficulty of Paying Living Expenses:    Food Insecurity:   . Worried About Charity fundraiser in the Last Year:   . Arboriculturist in the Last Year:   Transportation Needs:   . Film/video editor (Medical):   Marland Kitchen Lack of Transportation (Non-Medical):   Physical Activity:   . Days of Exercise per Week:   . Minutes of Exercise per Session:   Stress:   . Feeling of Stress :   Social Connections:   . Frequency of Communication with Friends and Family:   . Frequency of Social Gatherings with Friends and Family:   . Attends Religious Services:   . Active Member of Clubs or Organizations:   . Attends Archivist Meetings:   Marland Kitchen Marital Status:   Intimate Partner Violence:   . Fear of Current or Ex-Partner:   . Emotionally Abused:   Marland Kitchen Physically Abused:   . Sexually Abused:      Family History  Problem Relation Age of Onset  . COPD Mother   . Healthy Father   . Breast cancer Neg Hx      Current Outpatient Medications:  .  amLODipine (NORVASC) 5 MG tablet, Take 5 mg by mouth daily. , Disp: , Rfl:  .  Aspirin-Salicylamide-Caffeine (BC HEADACHE POWDER PO), Take 1-2 packets by mouth 2 (two) times daily as needed (pain). , Disp: , Rfl:  .  atorvastatin (LIPITOR) 40 MG tablet, Take 40 mg by mouth daily. , Disp: , Rfl:  .  levothyroxine (SYNTHROID) 150 MCG tablet, Take 150 mcg by mouth daily. , Disp: , Rfl:    Physical exam:  Vitals:   02/22/20 1529  BP: 110/78  Pulse: 82  Temp: 98.3 F (36.8 C)  TempSrc: Tympanic  Weight: 203 lb (92.1 kg)  Height: '5\' 11"'  (1.803 m)   Physical Exam HENT:     Head: Normocephalic and atraumatic.  Eyes:     Pupils: Pupils are equal, round, and reactive to light.  Cardiovascular:     Rate and Rhythm: Normal rate and regular rhythm.     Heart sounds: Normal heart sounds.  Pulmonary:     Effort: Pulmonary effort is normal.     Breath sounds: Normal breath sounds.  Abdominal:     General: Bowel sounds are normal.     Palpations: Abdomen is soft.  Musculoskeletal:      Cervical back: Normal range of motion.  Skin:    General: Skin is warm and dry.  Neurological:     Mental Status: She is alert and oriented to person, place, and time.     Patient is s/p right lumpectomy with a well-healed surgical scar.  No palpable masses in the left breast.  No palpable bilateral axillary adenopathy.   No flowsheet data found. No flowsheet data found.  No images are attached to the encounter.  MM DIAG BREAST TOMO UNI LEFT  Result Date: 02/03/2020 CLINICAL DATA:  Patient returns today to evaluate LEFT breast calcifications identified on recent screening mammogram. EXAM: DIGITAL DIAGNOSTIC UNILATERAL LEFT MAMMOGRAM WITH CAD AND TOMO COMPARISON:  Previous exam(s). ACR Breast Density Category c: The breast tissue is heterogeneously dense, which may obscure  small masses. FINDINGS: On today's additional diagnostic views, including magnification views, grouped indeterminate punctate and amorphous calcifications are confirmed within the lower outer quadrant of the LEFT breast, at posterior depth, measuring 8 mm extent. Mammographic images were processed with CAD. IMPRESSION: New grouped punctate and amorphous calcifications within the lower outer quadrant of the LEFT breast, at posterior depth, measuring 8 mm extent, with a suspicious linear distribution. Stereotactic biopsy is recommended. RECOMMENDATION: Stereotactic biopsy, with 3D tomosynthesis guidance, for the suspicious calcifications located within the lower outer quadrant of the LEFT breast. Patient will be scheduled for stereotactic biopsy at her convenience. I have discussed the findings and recommendations with the patient. If applicable, a reminder letter will be sent to the patient regarding the next appointment. BI-RADS CATEGORY  4: Suspicious. Electronically Signed   By: Franki Cabot M.D.   On: 02/03/2020 14:47   MM CLIP PLACEMENT LEFT  Result Date: 02/14/2020 CLINICAL DATA:  Post biopsy mammogram of the left breast  for clip placement. EXAM: DIAGNOSTIC LEFT MAMMOGRAM POST STEREOTACTIC BIOPSY COMPARISON:  Previous exam(s). FINDINGS: Mammographic images were obtained following stereotactic guided biopsy of calcifications in the lower outer left breast. The biopsy marking clip is in expected position at the site of biopsy. IMPRESSION: Appropriate positioning of the X shaped biopsy marking clip at the site of biopsy in the lower outer left breast. Final Assessment: Post Procedure Mammograms for Marker Placement Electronically Signed   By: Ammie Ferrier M.D.   On: 02/14/2020 09:16   MM LT BREAST BX W LOC DEV 1ST LESION IMAGE BX SPEC STEREO GUIDE  Addendum Date: 02/16/2020   ADDENDUM REPORT: 02/16/2020 09:50 ADDENDUM: PATHOLOGY revealed: A. BREAST, LEFT LOWER OUTER QUADRANT; STEREOTACTIC CORE NEEDLE BIOPSY: - DUCTAL CARCINOMA IN SITU, NUCLEAR GRADE 2-3, WITH COMEDONECROSIS, WITH ASSOCIATED CALCIFICATIONS. - NEGATIVE FOR INVASIVE CARCINOMA. Comment: DCIS is present in 2 of 4 tissue blocks, with a greatest linear extent of at least 5 mm. Pathology results are CONCORDANT with imaging findings, per Dr. Ammie Ferrier. Pathology results and recommendations below were discussed with patient by telephone on 02/16/2020. Patient reported biopsy site doing well with slight tenderness at the site. Post biopsy care instructions were reviewed and questions were answered. Patient was instructed to call [facility] if any concerns or questions arise related to the biopsy. Recommendation: Surgical referral and bilateral breast MRI due to intermediate to high grade DCIS. Request for surgical referral was relayed to [place] and Tanya Nones RN at [facility] by Electa Sniff RN on 02/16/2020. Addendum by Electa Sniff RN on 02/16/2020. Electronically Signed   By: Ammie Ferrier M.D.   On: 02/16/2020 09:50   Result Date: 02/16/2020 CLINICAL DATA:  64 year old female presenting for stereotactic biopsy of  left breast calcifications. EXAM: LEFT BREAST STEREOTACTIC CORE NEEDLE BIOPSY COMPARISON:  Previous exams. FINDINGS: The patient and I discussed the procedure of stereotactic-guided biopsy including benefits and alternatives. We discussed the high likelihood of a successful procedure. We discussed the risks of the procedure including infection, bleeding, tissue injury, clip migration, and inadequate sampling. Informed written consent was given. The usual time out protocol was performed immediately prior to the procedure. Using sterile technique and 1% Lidocaine as local anesthetic, under stereotactic guidance, a 9 gauge vacuum assisted device was used to perform core needle biopsy of calcifications in the lower outer quadrant of the left breast using a lateral approach. Specimen radiograph was performed showing calcifications in multiple core samples. Specimens with calcifications are identified  for pathology. Lesion quadrant: Lower outer quadrant At the conclusion of the procedure, X shaped tissue marker clip was deployed into the biopsy cavity. Follow-up 2-view mammogram was performed and dictated separately. IMPRESSION: Stereotactic-guided biopsy of calcifications in the lower outer left breast. No apparent complications. Electronically Signed: By: Ammie Ferrier M.D. On: 02/14/2020 09:01    Assessment and plan- Patient is a 64 y.o. female with prior history of stage III ER positive right breast cancer in 2007 s/p surgery, adjuvant radiation therapy and chemotherapy and 5 years of antiestrogen therapy now with left breast DCIS.  ER status will be determined on patient's final pathology.  Explained to the patient that DCIS is not invasive cancer at the time of invasive cancer can be found at the time of surgery.  She therefore requires lumpectomy and sentinel lymph node biopsy.  Only if margins are less than 2 mm positive for DCIS she would require additional surgery for reexcision.  I will see her after  her surgery to discuss final pathology and if her DCIS is ER positive she will need 5 years of antiestrogen therapy as well as adjuvant radiation treatment.   Also given the fact that this is her second breast cancer she would benefit from genetic counseling and genetic testing which we will refer today.  Treatment will be given with a curative intent.  There will be no indication for chemotherapy if there is no evidence of invasive cancer.   Thank you for this kind referral and the opportunity to participate in the care of this  Patient   Visit Diagnosis 1. Ductal carcinoma in situ (DCIS) of left breast   2. Goals of care, counseling/discussion     Dr. Randa Evens, MD, MPH [facility] at [facility] [phone] 02/26/2020

## 2020-02-29 ENCOUNTER — Inpatient Hospital Stay: Admission: RE | Admit: 2020-02-29 | Payer: No Typology Code available for payment source | Source: Ambulatory Visit

## 2020-03-01 ENCOUNTER — Encounter: Admission: RE | Admit: 2020-03-01 | Payer: No Typology Code available for payment source | Source: Ambulatory Visit

## 2020-03-01 ENCOUNTER — Inpatient Hospital Stay: Payer: PRIVATE HEALTH INSURANCE | Admitting: Genetic Counselor

## 2020-03-02 ENCOUNTER — Other Ambulatory Visit: Payer: Self-pay

## 2020-03-02 ENCOUNTER — Other Ambulatory Visit
Admission: RE | Admit: 2020-03-02 | Discharge: 2020-03-02 | Disposition: A | Discharge status: Home / Self Care | Payer: No Typology Code available for payment source | Source: Ambulatory Visit | Attending: General Surgery | Admitting: General Surgery

## 2020-03-02 ENCOUNTER — Encounter
Admission: RE | Admit: 2020-03-02 | Discharge: 2020-03-02 | Disposition: A | Discharge status: Home / Self Care | Payer: No Typology Code available for payment source | Source: Ambulatory Visit | Attending: General Surgery | Admitting: General Surgery

## 2020-03-02 DIAGNOSIS — Z20822 Contact with and (suspected) exposure to covid-19: Secondary | ICD-10-CM | POA: Diagnosis not present

## 2020-03-02 DIAGNOSIS — I10 Essential (primary) hypertension: Secondary | ICD-10-CM | POA: Diagnosis not present

## 2020-03-02 DIAGNOSIS — Z01818 Encounter for other preprocedural examination: Secondary | ICD-10-CM | POA: Diagnosis not present

## 2020-03-02 HISTORY — DX: Prediabetes: R73.03

## 2020-03-02 HISTORY — DX: Essential (primary) hypertension: I10

## 2020-03-02 HISTORY — DX: Hypothyroidism, unspecified: E03.9

## 2020-03-02 LAB — SARS CORONAVIRUS 2 (TAT 6-24 HRS): SARS Coronavirus 2: NEGATIVE

## 2020-03-02 NOTE — Patient Instructions (Addendum)
Your procedure is scheduled on: Monday 03/06/20 Report to [facility] @ 7:45am as instructed Same Day Surgery nurses station contact info [phone]  Remember: Instructions that are not followed completely may result in serious medical risk, up to and including death, or upon the discretion of your surgeon and anesthesiologist your surgery may need to be rescheduled.   __x__ 1. Do not eat food (including mints, candies, chewing gum) after midnight the night before your procedure. You may drink clear liquids up to 2 hours before you are scheduled to arrive at the hospital for your procedure.  Do not drink anything within 2 hours of your scheduled arrival to the hospital.  Approved clear liquids:  --Water or Apple juice without pulp  --Clear carbohydrate beverage such as Gatorade or Powerade  --Black Coffee or Clear Tea (No milk, no creamers, do not add anything to the coffee or tea)   __x__ 2. No alcohol or smoking for 24 hours before or after surgery.  __x__ 3. Notify your doctor if there is any change in your medical condition (cold, fever, infections).  __x__ 4. On the morning of surgery brush your teeth with toothpaste and water.  You may rinse your mouth with mouthwash if you wish.  Do not swallow any toothpaste or mouthwash.  __x__ Use antibacterial soap such as Dial to shower/bathe the night before surgery and on the day of surgery before arriving to hospital.  We will provide CHG antibacterial body wipes before surgery.  Do not wear jewelry, make-up, hairpins, clips or nail polish on the day of surgery. Do not wear lotions, powders, deodorant, or perfumes.  Do not shave below the face/neck 48 hours prior to surgery.  Do not bring valuables to the hospital.  [facility] is not responsible for any belongings or valuables.   Dentures or bridgework may not be worn into surgery. For patients discharged on the day of surgery, you will NOT be permitted to drive yourself home.   You must have a responsible adult with you for 24 hours after surgery.  __x__ Take these medicines on the morning of surgery with a SMALL SIP OF WATER:  1. Amlodipine (Norvasc)  2. Levothyroxine (Synthroid)  3. Atorvastatin (Lipitor)  _n/a_ Follow recommendations from Cardiologist, Pulmonologist or PCP regarding stopping Aspirin, Coumadin, Plavix, Eliquis, Effient, Pradaxa, and Pletal.  __x__ STARTING TODAY: Do not take any anti-inflammatories such as Advil, Ibuprofen, Motrin, Aleve, Naproxen, Naprosyn, BC/Goodies powders or aspirin products. You may take Tylenol if needed.   __x__ STARTING TODAY: Do not take any over the counter vitamins or herbal supplements until after surgery.  RN reviewed instructions with patient via pre-op telephone call 03/02/2020 @ 2:35pm.  Patient verbalized understanding.

## 2020-03-02 NOTE — Pre-Procedure Instructions (Signed)
EKG ordered for DOS per protocol, no recent EKG.  Patient has hypertension, on amlodipine.  COPD, no inhalers/O2 needed.  Per preop phone call, pt is able to walk and climb a flight of stairs without dyspnea or chest pain.  Pt verbalized understanding to refrain from smoking 24 hours prior to and after DOS.

## 2020-03-06 ENCOUNTER — Other Ambulatory Visit: Payer: Self-pay

## 2020-03-06 ENCOUNTER — Ambulatory Visit: Payer: No Typology Code available for payment source | Admitting: Anesthesiology

## 2020-03-06 ENCOUNTER — Encounter: Payer: Self-pay | Admitting: General Surgery

## 2020-03-06 ENCOUNTER — Ambulatory Visit
Admission: RE | Admit: 2020-03-06 | Discharge: 2020-03-06 | Disposition: A | Discharge status: Home / Self Care | Payer: No Typology Code available for payment source | Source: Ambulatory Visit | Attending: General Surgery | Admitting: General Surgery

## 2020-03-06 ENCOUNTER — Encounter
Admission: RE | Disposition: A | Discharge status: Home / Self Care | Payer: Self-pay | Source: Ambulatory Visit | Attending: General Surgery

## 2020-03-06 ENCOUNTER — Ambulatory Visit (HOSPITAL_BASED_OUTPATIENT_CLINIC_OR_DEPARTMENT_OTHER)
Admission: RE | Admit: 2020-03-06 | Discharge: 2020-03-06 | Disposition: A | Discharge status: Home / Self Care | Payer: No Typology Code available for payment source | Source: Ambulatory Visit | Attending: General Surgery | Admitting: General Surgery

## 2020-03-06 DIAGNOSIS — D0512 Intraductal carcinoma in situ of left breast: Secondary | ICD-10-CM | POA: Insufficient documentation

## 2020-03-06 DIAGNOSIS — I10 Essential (primary) hypertension: Secondary | ICD-10-CM | POA: Insufficient documentation

## 2020-03-06 DIAGNOSIS — F17210 Nicotine dependence, cigarettes, uncomplicated: Secondary | ICD-10-CM | POA: Diagnosis not present

## 2020-03-06 DIAGNOSIS — E785 Hyperlipidemia, unspecified: Secondary | ICD-10-CM | POA: Insufficient documentation

## 2020-03-06 DIAGNOSIS — E119 Type 2 diabetes mellitus without complications: Secondary | ICD-10-CM | POA: Diagnosis not present

## 2020-03-06 DIAGNOSIS — Z6831 Body mass index (BMI) 31.0-31.9, adult: Secondary | ICD-10-CM | POA: Diagnosis not present

## 2020-03-06 DIAGNOSIS — E669 Obesity, unspecified: Secondary | ICD-10-CM | POA: Diagnosis not present

## 2020-03-06 DIAGNOSIS — Z79899 Other long term (current) drug therapy: Secondary | ICD-10-CM | POA: Diagnosis not present

## 2020-03-06 DIAGNOSIS — Z79890 Hormone replacement therapy (postmenopausal): Secondary | ICD-10-CM | POA: Diagnosis not present

## 2020-03-06 DIAGNOSIS — E039 Hypothyroidism, unspecified: Secondary | ICD-10-CM | POA: Insufficient documentation

## 2020-03-06 DIAGNOSIS — C50912 Malignant neoplasm of unspecified site of left female breast: Secondary | ICD-10-CM | POA: Diagnosis not present

## 2020-03-06 DIAGNOSIS — J449 Chronic obstructive pulmonary disease, unspecified: Secondary | ICD-10-CM | POA: Diagnosis not present

## 2020-03-06 DIAGNOSIS — E7800 Pure hypercholesterolemia, unspecified: Secondary | ICD-10-CM | POA: Insufficient documentation

## 2020-03-06 HISTORY — PX: PARTIAL MASTECTOMY WITH NEEDLE LOCALIZATION AND AXILLARY SENTINEL LYMPH NODE BX: SHX6009

## 2020-03-06 LAB — GLUCOSE, CAPILLARY: Glucose-Capillary: 169 mg/dL — ABNORMAL HIGH (ref 70–99)

## 2020-03-06 SURGERY — PARTIAL MASTECTOMY WITH NEEDLE LOCALIZATION AND AXILLARY SENTINEL LYMPH NODE BX
Anesthesia: General | Site: Breast | Laterality: Left

## 2020-03-06 MED ORDER — FENTANYL CITRATE (PF) 100 MCG/2ML IJ SOLN
25.0000 ug | INTRAMUSCULAR | Status: DC | PRN
  Administered 2020-03-06: 50 ug via INTRAVENOUS
  Administered 2020-03-06: 18:00:00 25 ug via INTRAVENOUS

## 2020-03-06 MED ORDER — GLYCOPYRROLATE 0.2 MG/ML IJ SOLN
INTRAMUSCULAR | Status: DC | PRN
  Administered 2020-03-06: .2 mg via INTRAVENOUS

## 2020-03-06 MED ORDER — MIDAZOLAM HCL 2 MG/2ML IJ SOLN
INTRAMUSCULAR | Status: AC
  Filled 2020-03-06: qty 2

## 2020-03-06 MED ORDER — KETOROLAC TROMETHAMINE 30 MG/ML IJ SOLN
INTRAMUSCULAR | Status: DC | PRN
  Administered 2020-03-06: 30 mg via INTRAVENOUS

## 2020-03-06 MED ORDER — ONDANSETRON HCL 4 MG/2ML IJ SOLN: 4.0000 mg | Freq: Once | INTRAMUSCULAR | Status: DC | PRN

## 2020-03-06 MED ORDER — SODIUM CHLORIDE FLUSH 0.9 % IV SOLN
INTRAVENOUS | Status: AC
  Filled 2020-03-06: qty 10

## 2020-03-06 MED ORDER — FAMOTIDINE 20 MG PO TABS: 20.0000 mg | ORAL_TABLET | Freq: Once | ORAL | Status: AC

## 2020-03-06 MED ORDER — OXYCODONE HCL 5 MG/5ML PO SOLN: 5.0000 mg | Freq: Once | ORAL | Status: AC | PRN

## 2020-03-06 MED ORDER — ACETAMINOPHEN 10 MG/ML IV SOLN
INTRAVENOUS | Status: AC
  Filled 2020-03-06: qty 100

## 2020-03-06 MED ORDER — CEFAZOLIN SODIUM-DEXTROSE 2-4 GM/100ML-% IV SOLN
2.0000 g | INTRAVENOUS | Status: AC
  Administered 2020-03-06: 2 g via INTRAVENOUS

## 2020-03-06 MED ORDER — IPRATROPIUM-ALBUTEROL 0.5-2.5 (3) MG/3ML IN SOLN
3.0000 mL | Freq: Once | RESPIRATORY_TRACT | Status: AC
  Administered 2020-03-06: 17:00:00 3 mL via RESPIRATORY_TRACT

## 2020-03-06 MED ORDER — TECHNETIUM TC 99M SULFUR COLLOID
1.0000 | Freq: Once | INTRAVENOUS | Status: AC | PRN
  Administered 2020-03-06: 09:00:00 0.82 via INTRAVENOUS

## 2020-03-06 MED ORDER — FAMOTIDINE 20 MG PO TABS
ORAL_TABLET | ORAL | Status: AC
  Administered 2020-03-06: 10:00:00 20 mg via ORAL
  Filled 2020-03-06: qty 1

## 2020-03-06 MED ORDER — IPRATROPIUM-ALBUTEROL 0.5-2.5 (3) MG/3ML IN SOLN
RESPIRATORY_TRACT | Status: AC
  Filled 2020-03-06: qty 3

## 2020-03-06 MED ORDER — LIDOCAINE HCL (CARDIAC) PF 100 MG/5ML IV SOSY
PREFILLED_SYRINGE | INTRAVENOUS | Status: DC | PRN
  Administered 2020-03-06: 100 mg via INTRAVENOUS

## 2020-03-06 MED ORDER — FENTANYL CITRATE (PF) 100 MCG/2ML IJ SOLN
INTRAMUSCULAR | Status: AC
  Administered 2020-03-06: 25 ug via INTRAVENOUS
  Filled 2020-03-06: qty 2

## 2020-03-06 MED ORDER — DEXAMETHASONE SODIUM PHOSPHATE 10 MG/ML IJ SOLN
INTRAMUSCULAR | Status: DC | PRN
  Administered 2020-03-06: 10 mg via INTRAVENOUS

## 2020-03-06 MED ORDER — FENTANYL CITRATE (PF) 100 MCG/2ML IJ SOLN
INTRAMUSCULAR | Status: DC | PRN
  Administered 2020-03-06 (×2): 50 ug via INTRAVENOUS
  Administered 2020-03-06 (×4): 25 ug via INTRAVENOUS

## 2020-03-06 MED ORDER — MIDAZOLAM HCL 2 MG/2ML IJ SOLN
INTRAMUSCULAR | Status: DC | PRN
  Administered 2020-03-06: 2 mg via INTRAVENOUS

## 2020-03-06 MED ORDER — LACTATED RINGERS IV SOLN: INTRAVENOUS | Status: DC | PRN

## 2020-03-06 MED ORDER — EPINEPHRINE PF 1 MG/ML IJ SOLN
INTRAMUSCULAR | Status: AC
  Filled 2020-03-06: qty 1

## 2020-03-06 MED ORDER — OXYCODONE HCL 5 MG PO TABS
5.0000 mg | ORAL_TABLET | Freq: Once | ORAL | Status: AC | PRN
  Administered 2020-03-06: 5 mg via ORAL

## 2020-03-06 MED ORDER — PROPOFOL 10 MG/ML IV BOLUS
INTRAVENOUS | Status: DC | PRN
  Administered 2020-03-06: 200 mg via INTRAVENOUS

## 2020-03-06 MED ORDER — CEFAZOLIN SODIUM-DEXTROSE 2-4 GM/100ML-% IV SOLN
INTRAVENOUS | Status: AC
  Filled 2020-03-06: qty 100

## 2020-03-06 MED ORDER — BUPIVACAINE-EPINEPHRINE (PF) 0.5% -1:200000 IJ SOLN
INTRAMUSCULAR | Status: DC | PRN
  Administered 2020-03-06: 30 mL

## 2020-03-06 MED ORDER — PROPOFOL 10 MG/ML IV BOLUS
INTRAVENOUS | Status: AC
  Filled 2020-03-06: qty 20

## 2020-03-06 MED ORDER — BUPIVACAINE HCL (PF) 0.5 % IJ SOLN
INTRAMUSCULAR | Status: AC
  Filled 2020-03-06: qty 30

## 2020-03-06 MED ORDER — ACETAMINOPHEN 10 MG/ML IV SOLN: 1000.0000 mg | Freq: Once | INTRAVENOUS | Status: DC | PRN

## 2020-03-06 MED ORDER — ACETAMINOPHEN 10 MG/ML IV SOLN
INTRAVENOUS | Status: DC | PRN
  Administered 2020-03-06: 1000 mg via INTRAVENOUS

## 2020-03-06 MED ORDER — LACTATED RINGERS IV SOLN
INTRAVENOUS | Status: DC
  Administered 2020-03-06: 10:00:00 75 mL/h via INTRAVENOUS

## 2020-03-06 MED ORDER — FENTANYL CITRATE (PF) 100 MCG/2ML IJ SOLN
INTRAMUSCULAR | Status: AC
  Filled 2020-03-06: qty 2

## 2020-03-06 MED ORDER — METHYLENE BLUE 0.5 % INJ SOLN
INTRAVENOUS | Status: AC
  Filled 2020-03-06: qty 10

## 2020-03-06 MED ORDER — OXYCODONE HCL 5 MG PO TABS
ORAL_TABLET | ORAL | Status: AC
  Filled 2020-03-06: qty 1

## 2020-03-06 MED ORDER — PHENYLEPHRINE HCL (PRESSORS) 10 MG/ML IV SOLN
INTRAVENOUS | Status: DC | PRN
  Administered 2020-03-06: 100 ug via INTRAVENOUS

## 2020-03-06 MED ORDER — HYDROCODONE-ACETAMINOPHEN 5-325 MG PO TABS: 1.0000 | ORAL_TABLET | ORAL | 0 refills | Status: AC | PRN

## 2020-03-06 MED ORDER — ONDANSETRON HCL 4 MG/2ML IJ SOLN
INTRAMUSCULAR | Status: DC | PRN
  Administered 2020-03-06 (×2): 4 mg via INTRAVENOUS

## 2020-03-06 SURGICAL SUPPLY — 53 items
ADH SKN CLS APL DERMABOND .7 (GAUZE/BANDAGES/DRESSINGS) ×1
APL PRP STRL LF DISP 70% ISPRP (MISCELLANEOUS) ×1
BINDER BREAST LRG (GAUZE/BANDAGES/DRESSINGS) IMPLANT
BINDER BREAST MEDIUM (GAUZE/BANDAGES/DRESSINGS) IMPLANT
BINDER BREAST XLRG (GAUZE/BANDAGES/DRESSINGS) IMPLANT
BINDER BREAST XXLRG (GAUZE/BANDAGES/DRESSINGS) IMPLANT
BLADE SURG 15 STRL LF DISP TIS (BLADE) ×2 IMPLANT
BLADE SURG 15 STRL SS (BLADE) ×4
CANISTER SUCT 1200ML W/VALVE (MISCELLANEOUS) ×2 IMPLANT
CHLORAPREP W/TINT 26 (MISCELLANEOUS) ×2 IMPLANT
CNTNR SPEC 2.5X3XGRAD LEK (MISCELLANEOUS)
CONT SPEC 4OZ STER OR WHT (MISCELLANEOUS)
CONT SPEC 4OZ STRL OR WHT (MISCELLANEOUS)
CONTAINER SPEC 2.5X3XGRAD LEK (MISCELLANEOUS) IMPLANT
COVER WAND RF STERILE (DRAPES) ×2 IMPLANT
DERMABOND ADVANCED (GAUZE/BANDAGES/DRESSINGS) ×1
DERMABOND ADVANCED .7 DNX12 (GAUZE/BANDAGES/DRESSINGS) ×1 IMPLANT
DEVICE DUBIN SPECIMEN MAMMOGRA (MISCELLANEOUS) ×2 IMPLANT
DRAPE LAPAROTOMY TRNSV 106X77 (MISCELLANEOUS) ×2 IMPLANT
DRSG GAUZE FLUFF 36X18 (GAUZE/BANDAGES/DRESSINGS) IMPLANT
ELECT CAUTERY BLADE 6.4 (BLADE) ×2 IMPLANT
ELECT REM PT RETURN 9FT ADLT (ELECTROSURGICAL) ×2
ELECTRODE REM PT RTRN 9FT ADLT (ELECTROSURGICAL) ×1 IMPLANT
GAUZE 4X4 16PLY RFD (DISPOSABLE) ×1 IMPLANT
GLOVE BIO SURGEON STRL SZ 6.5 (GLOVE) ×2 IMPLANT
GLOVE BIOGEL PI IND STRL 6.5 (GLOVE) ×1 IMPLANT
GLOVE BIOGEL PI INDICATOR 6.5 (GLOVE) ×1
GOWN STRL REUS W/ TWL LRG LVL3 (GOWN DISPOSABLE) ×2 IMPLANT
GOWN STRL REUS W/TWL LRG LVL3 (GOWN DISPOSABLE) ×4
KIT MARKER MARGIN INK (KITS) ×1 IMPLANT
KIT TURNOVER KIT A (KITS) ×2 IMPLANT
LABEL OR SOLS (LABEL) ×2 IMPLANT
MARGIN MAP 10MM (MISCELLANEOUS) ×2 IMPLANT
MARKER MARGIN CORRECT CLIP (MARKER) ×1 IMPLANT
NDL HYPO 25X1 1.5 SAFETY (NEEDLE) ×1 IMPLANT
NEEDLE HYPO 22GX1.5 SAFETY (NEEDLE) ×2 IMPLANT
NEEDLE HYPO 25X1 1.5 SAFETY (NEEDLE) ×2 IMPLANT
PACK BASIN MINOR ARMC (MISCELLANEOUS) ×2 IMPLANT
RETRACTOR RING XSMALL (MISCELLANEOUS) IMPLANT
RTRCTR WOUND ALEXIS 13CM XS SH (MISCELLANEOUS) ×2
SLEVE PROBE SENORX GAMMA FIND (MISCELLANEOUS) ×2 IMPLANT
SUT ETHILON 3-0 FS-10 30 BLK (SUTURE) ×4
SUT MNCRL 4-0 (SUTURE) ×4
SUT MNCRL 4-0 27XMFL (SUTURE) ×2
SUT PROLENE 3 0 CT 1 (SUTURE) ×1 IMPLANT
SUT SILK 2 0 SH (SUTURE) ×2 IMPLANT
SUT VIC AB 3-0 SH 27 (SUTURE) ×4
SUT VIC AB 3-0 SH 27X BRD (SUTURE) ×2 IMPLANT
SUTURE EHLN 3-0 FS-10 30 BLK (SUTURE) ×1 IMPLANT
SUTURE MNCRL 4-0 27XMF (SUTURE) ×2 IMPLANT
SYR 10ML LL (SYRINGE) ×4 IMPLANT
SYR BULB IRRIG 60ML STRL (SYRINGE) ×2 IMPLANT
WATER STERILE IRR 1000ML POUR (IV SOLUTION) ×2 IMPLANT

## 2020-03-06 NOTE — Op Note (Signed)
Preoperative diagnosis: Left breast ductal carcinoma in situ with comedonecrosis.  Postoperative diagnosis: Left breast ductal carcinoma in situ with comedonecrosis.   Procedure: Left radiofrequency tag-localized partial mastectomy.                       Left Axillary Sentinel Lymph node biopsy  Anesthesia: GETA  Surgeon: Dr. Windell Moment  Wound Classification: Clean  Indications: Patient is a 64 y.o. female with a nonpalpable left breast mass noted on mammography with core biopsy demonstrating ductal carcinoma in situ with comedonecrosis that requires radiofrequency tag-localized partial mastectomy for treatment with sentinel lymph node biopsy.   Findings: 1. Specimen mammography shows marker and tag on specimen 2. Pathology call refers gross examination of margins was close to anterior margin. Anterior margin re excised.  3. No other palpable mass or lymph node identified.   Description of procedure: Preoperative radiofrequency tag localization was performed by radiology. In the nuclear medicine suite, the subareolar region was injected with Tc-99 sulfur colloid. Localization studies were reviewed. The patient was taken to the operating room and placed supine on the operating table, and after general anesthesia the left chest and axilla were prepped and draped in the usual sterile fashion. A time-out was completed verifying correct patient, procedure, site, positioning, and implant(s) and/or special equipment prior to beginning this procedure.  By comparing the localization studies and interrogation with Localizer device, the probable trajectory and location of the mass was visualized. A circumareolar skin incision was planned in such a way as to minimize the amount of dissection to reach the mass.  The skin incision was made. Flaps were raised and the location of the tag was confirmed with Localizer device confirmed. A 2-0 silk figure-of-eight stay suture was placed and used for retraction.  Dissection was then taken down circumferentially, taking care to include the entire localizing tag and a wide margin of grossly normal tissue. The specimen and entire localizing tag were removed. The specimen was oriented and sent to radiology with the localization studies.  Pathology call and notify that biopsy cavity was close to anterior margin.  The anterior margin was reexcised, oriented and sent to pathology for permanent. The wound was irrigated. Hemostasis was checked. The wound was closed with interrupted sutures of 4-0 Vicryl and a subcuticular suture of Monocryl 4-0. No attempt was made to close the dead space. A dressing was applied.   A hand-held gamma probe was used to identify the location of the hottest spot in the axilla. An incision was made around the caudal axillary hairline. Dissection was carried down until subdermal facias was advanced. The probe was placed and again, the point of maximal count was found. Dissection continue until nodule was identified. The node was excised in its entirety. Ex vivo, the node measured 2166 counts when placed on the probe. The bed of the node measured 21 counts. No additional hot spots were identified. No clinically abnormal nodes were palpated. The procedure was terminated. Hemostasis was achieved and the wound closed in layers with deep interrupted 4-0 Vicryl and skin was closed with subcuticular suture of Monocryl 4-0.  The patient tolerated the procedure well and was taken to the postanesthesia care unit in stable condition.   Specimen: Left Breast mass                     Sentinel Lymph nodes  Reexcision of anterior margin  Complications: None  Estimated Blood Loss: 10 mL

## 2020-03-06 NOTE — Anesthesia Preprocedure Evaluation (Signed)
Anesthesia Evaluation  Patient identified by MRN, date of birth, ID band Patient awake    Reviewed: Allergy & Precautions, NPO status , Patient's Chart, lab work & pertinent test results  History of Anesthesia Complications Negative for: history of anesthetic complications  Airway Mallampati: II  TM Distance: >3 FB Neck ROM: Full    Dental no notable dental hx. (+) Teeth Intact, Dental Advisory Given   Pulmonary neg sleep apnea, COPD, Current Smoker and Patient abstained from smoking.,  Does not use inhalers. Never hospitalized for CopD exacerbation   Pulmonary exam normal breath sounds clear to auscultation       Cardiovascular Exercise Tolerance: Good METShypertension, Pt. on medications (-) CAD and (-) Past MI (-) dysrhythmias  Rhythm:Regular Rate:Normal - Systolic murmurs    Neuro/Psych negative neurological ROS  negative psych ROS   GI/Hepatic neg GERD  ,(+)     (-) substance abuse  ,   Endo/Other  neg diabetesHypothyroidism   Renal/GU negative Renal ROS     Musculoskeletal   Abdominal   Peds  Hematology   Anesthesia Other Findings Past Medical History: 2007: Breast cancer ([place])     Comment:  RT LUMPECTOMY No date: Hypertension No date: Hypothyroidism No date: Personal history of chemotherapy No date: Personal history of radiation therapy No date: Pre-diabetes  Reproductive/Obstetrics                             Anesthesia Physical Anesthesia Plan  ASA: II  Anesthesia Plan: General   Post-op Pain Management:    Induction: Intravenous  PONV Risk Score and Plan: 2 and Ondansetron, Dexamethasone and Midazolam  Airway Management Planned: Oral ETT and LMA  Additional Equipment: None  Intra-op Plan:   Post-operative Plan: Extubation in OR  Informed Consent: I have reviewed the patients History and Physical, chart, labs and discussed the procedure including the risks,  benefits and alternatives for the proposed anesthesia with the patient or authorized representative who has indicated his/her understanding and acceptance.     Dental advisory given  Plan Discussed with: CRNA and Surgeon  Anesthesia Plan Comments: (Discussed risks of anesthesia with patient, including PONV, sore throat, lip/dental damage. Rare risks discussed as well, such as cardiorespiratory and neurological sequelae. Patient understands.)        Anesthesia Quick Evaluation

## 2020-03-06 NOTE — Interval H&P Note (Signed)
History and Physical Interval Note:  03/06/2020 1:20 PM  Margaret Douglas  has presented today for surgery, with the diagnosis of D05.12 DCIS of Lt breast.  The various methods of treatment have been discussed with the patient and family. After consideration of risks, benefits and other options for treatment, the patient has consented to  Procedure(s): PARTIAL MASTECTOMY WITH NEEDLE LOCALIZATION AND AXILLARY SENTINEL LYMPH NODE BX (Left) as a surgical intervention.  The patient's history has been reviewed, patient examined, no change in status, stable for surgery.  I have reviewed the patient's chart and labs.  Left breast marked in the pre procedure room. Questions were answered to the patient's satisfaction.     Herbert Pun

## 2020-03-06 NOTE — Discharge Instructions (Signed)
°  Diet: Resume home heart healthy regular diet.  ° °Activity: Increase activity as tolerated. Light activity and walking are encouraged. Do not drive or drink alcohol if taking narcotic pain medications. ° °Wound care: May shower with soapy water and pat dry (do not rub incisions), but no baths or submerging incision underwater until follow-up. (no swimming)  ° °Medications: Resume all home medications. For mild to moderate pain: acetaminophen (Tylenol) or ibuprofen (if no kidney disease). Combining Tylenol with alcohol can substantially increase your risk of causing liver disease. Narcotic pain medications, if prescribed, can be used for severe pain, though may cause nausea, constipation, and drowsiness. Do not combine Tylenol and Norco within a 6 hour period as Norco contains Tylenol. If you do not need the narcotic pain medication, you do not need to fill the prescription. ° °Call office ([phone]) at any time if any questions, worsening pain, fevers/chills, bleeding, drainage from incision site, or other concerns. ° °AMBULATORY SURGERY  °DISCHARGE INSTRUCTIONS ° ° °1) The drugs that you were given will stay in your system until tomorrow so for the next 24 hours you should not: ° °A) Drive an automobile °B) Make any legal decisions °C) Drink any alcoholic beverage ° ° °2) You may resume regular meals tomorrow.  Today it is better to start with liquids and gradually work up to solid foods. ° °You may eat anything you prefer, but it is better to start with liquids, then soup and crackers, and gradually work up to solid foods. ° ° °3) Please notify your doctor immediately if you have any unusual bleeding, trouble breathing, redness and pain at the surgery site, drainage, fever, or pain not relieved by medication. ° ° ° °4) Additional Instructions: ° ° ° ° ° ° ° °Please contact your physician with any problems or Same Day Surgery at [phone], Monday through Friday 6 am to 4 pm, or [place] [facility] [place] [facility]  number at [phone]. °

## 2020-03-06 NOTE — Anesthesia Postprocedure Evaluation (Signed)
Anesthesia Post Note  Patient: Margaret Douglas  Procedure(s) Performed: PARTIAL MASTECTOMY WITH NEEDLE LOCALIZATION AND AXILLARY SENTINEL LYMPH NODE BX (Left Breast)  Patient location during evaluation: PACU Anesthesia Type: General Level of consciousness: awake and alert Pain management: pain level controlled Vital Signs Assessment: post-procedure vital signs reviewed and stable Respiratory status: spontaneous breathing and respiratory function stable Cardiovascular status: stable Anesthetic complications: no     Last Vitals:  Vitals:   03/06/20 1809 03/06/20 1821  BP:  (!) 145/68  Pulse: 89 91  Resp: 14 18  Temp:    SpO2: 93% 92%    Last Pain:  Vitals:   03/06/20 1821  TempSrc:   PainSc: 4                  Jordy Hewins K

## 2020-03-06 NOTE — Anesthesia Procedure Notes (Signed)
Procedure Name: LMA Insertion Performed by: Fletcher-Harrison, Paris Chir[place], CRNA Pre-anesthesia Checklist: Patient identified, Emergency Drugs available, Suction available and Patient being monitored Patient Re-evaluated:Patient Re-evaluated prior to induction Oxygen Delivery Method: Circle system utilized Preoxygenation: Pre-oxygenation with 100% oxygen Induction Type: IV induction Ventilation: Mask ventilation without difficulty LMA: LMA inserted LMA Size: 4.0 Number of attempts: 1 Placement Confirmation: positive ETCO2,  CO2 detector and breath sounds checked- equal and bilateral Tube secured with: Tape Dental Injury: Teeth and Oropharynx as per pre-operative assessment        

## 2020-03-06 NOTE — Transfer of Care (Signed)
Immediate Anesthesia Transfer of Care Note  Patient: Margaret Douglas  Procedure(s) Performed: PARTIAL MASTECTOMY WITH NEEDLE LOCALIZATION AND AXILLARY SENTINEL LYMPH NODE BX (Left Breast)  Patient Location: PACU  Anesthesia Type:General  Level of Consciousness: awake, alert  and oriented  Airway & Oxygen Therapy: Patient Spontanous Breathing and Patient connected to face mask oxygen  Post-op Assessment: Report given to RN and Post -op Vital signs reviewed and stable  Post vital signs: Reviewed and stable  Last Vitals:  Vitals Value Taken Time  BP    Temp    Pulse 102 03/06/20 1653  Resp 24 03/06/20 1653  SpO2 95 % 03/06/20 1653  Vitals shown include unvalidated device data.  Last Pain:  Vitals:   03/06/20 0921  TempSrc: Tympanic  PainSc: 0-No pain         Complications: No apparent anesthesia complications

## 2020-03-08 LAB — SURGICAL PATHOLOGY

## 2020-03-09 LAB — SURGICAL PATHOLOGY

## 2020-03-16 ENCOUNTER — Encounter: Payer: Self-pay | Admitting: *Deleted

## 2020-03-16 ENCOUNTER — Other Ambulatory Visit: Payer: Self-pay

## 2020-03-16 NOTE — Progress Notes (Signed)
Called to follow up with her post surgery.  No answer.  She has a follow up tomorrow with Dr. Janese Banks.  Left message for her to return my call if any questions or needs.

## 2020-03-17 ENCOUNTER — Other Ambulatory Visit: Payer: Self-pay

## 2020-03-17 ENCOUNTER — Inpatient Hospital Stay (HOSPITAL_BASED_OUTPATIENT_CLINIC_OR_DEPARTMENT_OTHER): Payer: PRIVATE HEALTH INSURANCE | Admitting: Oncology

## 2020-03-17 ENCOUNTER — Encounter: Payer: Self-pay | Admitting: Radiation Oncology

## 2020-03-17 ENCOUNTER — Ambulatory Visit
Admission: RE | Admit: 2020-03-17 | Discharge: 2020-03-17 | Disposition: A | Discharge status: Home / Self Care | Payer: No Typology Code available for payment source | Source: Ambulatory Visit | Attending: Radiation Oncology | Admitting: Radiation Oncology

## 2020-03-17 ENCOUNTER — Encounter: Payer: Self-pay | Admitting: Oncology

## 2020-03-17 VITALS — BP 166/88 | HR 79 | Temp 96.6°F | Resp 16 | Wt 211.3 lb

## 2020-03-17 DIAGNOSIS — Z170 Estrogen receptor positive status [ER+]: Secondary | ICD-10-CM | POA: Insufficient documentation

## 2020-03-17 DIAGNOSIS — D0512 Intraductal carcinoma in situ of left breast: Secondary | ICD-10-CM | POA: Diagnosis not present

## 2020-03-17 DIAGNOSIS — Z7189 Other specified counseling: Secondary | ICD-10-CM | POA: Diagnosis not present

## 2020-03-17 DIAGNOSIS — F17210 Nicotine dependence, cigarettes, uncomplicated: Secondary | ICD-10-CM | POA: Insufficient documentation

## 2020-03-17 DIAGNOSIS — I10 Essential (primary) hypertension: Secondary | ICD-10-CM | POA: Insufficient documentation

## 2020-03-17 DIAGNOSIS — Z510 Encounter for antineoplastic radiation therapy: Secondary | ICD-10-CM | POA: Insufficient documentation

## 2020-03-17 DIAGNOSIS — E039 Hypothyroidism, unspecified: Secondary | ICD-10-CM | POA: Insufficient documentation

## 2020-03-17 DIAGNOSIS — Z923 Personal history of irradiation: Secondary | ICD-10-CM | POA: Insufficient documentation

## 2020-03-17 DIAGNOSIS — Z9221 Personal history of antineoplastic chemotherapy: Secondary | ICD-10-CM | POA: Insufficient documentation

## 2020-03-17 MED ORDER — LETROZOLE 2.5 MG PO TABS: 2.5000 mg | ORAL_TABLET | Freq: Every day | ORAL | 3 refills | Status: DC

## 2020-03-17 NOTE — Consult Note (Signed)
NEW PATIENT EVALUATION  Name: Margaret Douglas  MRN: [id number]  Date:   03/17/2020     DOB: 1956-05-30   This 64 y.o. 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  REFERRING PHYSICIAN: Dion Body, MD  CHIEF COMPLAINT:  Chief Complaint  Patient presents with  . Breast Cancer    Initial consultation    DIAGNOSIS: The encounter diagnosis was Ductal carcinoma in situ (DCIS) of left breast.   PREVIOUS INVESTIGATIONS:  Mammogram and ultrasound reviewed Clinical notes reviewed Pathology report reviewed  HPI: Patient is a 64 year old female previously treated in our department over 15 years prior to the right breast for stage III invasive mammary carcinoma.  She recently presented with abnormal mammogram of the left breast showing punctate and amorphous calcifications within the lower outer quadrant suspicious.  Stereotactic biopsy was performed showing ER positive ductal carcinoma in situ.  She went on to have a wide local excision showing a 5 mm residual nuclear grade 2-3 ductal carcinoma in situ with comedonecrosis.  Margins were clear with greater than 2 mm from all margins.  1 sentinel lymph node was examined negative for malignancy.  She has done well postoperatively although is still somewhat sore from her recent surgery.  She is seen today for radiation oncology opinion.  PLANNED TREATMENT REGIMEN: Left whole breast radiation  PAST MEDICAL HISTORY:  has a past medical history of Breast cancer ([place]) (2007), Hypertension, Hypothyroidism, Personal history of chemotherapy, Personal history of radiation therapy, and Pre-diabetes.    PAST SURGICAL HISTORY:  Past Surgical History:  Procedure Laterality Date  . BREAST BIOPSY Right 2007   breast ca rad and chemo  . BREAST BIOPSY Left  02/14/2020   Affirm bx-"x" clip-DUCTAL CARCINOMA IN SITU, NUCLEAR GRADE 2-3, WITH COMEDONECROSIS  . BREAST LUMPECTOMY    . PARTIAL MASTECTOMY WITH NEEDLE LOCALIZATION AND AXILLARY SENTINEL LYMPH NODE BX Left 03/06/2020   Procedure: PARTIAL MASTECTOMY WITH NEEDLE LOCALIZATION AND AXILLARY SENTINEL LYMPH NODE BX;  Surgeon: Herbert Pun, MD;  Location: [facility] ORS;  Service: General;  Laterality: Left;    FAMILY HISTORY: family history includes COPD in her mother; Healthy in her father.  SOCIAL HISTORY:  reports that she has been smoking cigarettes. She has a 17.50 pack-year smoking history. She has never used smokeless tobacco. She reports current alcohol use of about 1.0 standard drinks of alcohol per week. She reports previous drug use.  ALLERGIES: Patient has no known allergies.  MEDICATIONS:  Current Outpatient Medications  Medication Sig Dispense Refill  . amLODipine (NORVASC) 5 MG tablet Take 5 mg by mouth daily.     . Aspirin-Salicylamide-Caffeine (BC HEADACHE POWDER PO) Take 1-2 packets by mouth 2 (two) times daily as needed (pain).     Marland Kitchen atorvastatin (LIPITOR) 40 MG tablet Take 40 mg by mouth daily.     Marland Kitchen levothyroxine (SYNTHROID) 150 MCG tablet Take 150 mcg by mouth daily.      No current facility-administered medications for this encounter.    ECOG PERFORMANCE STATUS:  0 - Asymptomatic  REVIEW OF SYSTEMS: Patient denies any weight loss, fatigue, weakness, fever, chills or night sweats. Patient denies any loss of vision, blurred vision. Patient denies any ringing  of the ears or hearing loss. No irregular heartbeat. Patient denies heart murmur or history of fainting. Patient denies any  chest pain or pain radiating to her upper extremities. Patient denies any shortness of breath, difficulty breathing at night, cough or hemoptysis. Patient denies any swelling in the lower legs. Patient denies any nausea vomiting, vomiting of blood, or coffee ground material in the vomitus.  Patient denies any stomach pain. Patient states has had normal bowel movements no significant constipation or diarrhea. Patient denies any dysuria, hematuria or significant nocturia. Patient denies any problems walking, swelling in the joints or loss of balance. Patient denies any skin changes, loss of hair or loss of weight. Patient denies any excessive worrying or anxiety or significant depression. Patient denies any problems with insomnia. Patient denies excessive thirst, polyuria, polydipsia. Patient denies any swollen glands, patient denies easy bruising or easy bleeding. Patient denies any recent infections, allergies or URI. Patient "s visual fields have not changed significantly in recent time.   PHYSICAL EXAM: BP (!) 166/88 (BP Location: Left Arm, Patient Position: Sitting)   Pulse 79   Temp (!) 96.6 F (35.9 C) (Tympanic)   Resp 16   Wt 211 lb 4.8 oz (95.8 kg)   BMI 30.32 kg/m  Right breast is markedly shrunk as compared to the left from prior radiation therapy and surgery.  Her left breast is rather large and pendulous.  No dominant mass or nodularity is noted in either breast in 2 positions examined.  She is a recent incision in the left breast which is healing well.  No axillary or supraclavicular adenopathy is appreciated.  Well-developed well-nourished patient in NAD. HEENT reveals PERLA, EOMI, discs not visualized.  Oral cavity is clear. No oral mucosal lesions are identified. Neck is clear without evidence of cervical or supraclavicular adenopathy. Lungs are clear to A&P. Cardiac examination is essentially unremarkable with regular rate and rhythm without murmur rub or thrill. Abdomen is benign with no organomegaly or masses noted. Motor sensory and DTR levels are equal and symmetric in the upper and lower extremities. Cranial nerves II through XII are grossly intact. Proprioception is intact. No peripheral adenopathy or edema is identified. No motor or sensory levels are noted. Crude  visual fields are within normal range.  LABORATORY DATA: Pathology report reviewed    RADIOLOGY RESULTS: Mammograms reviewed   IMPRESSION: Stage 0 (Tis N0 M0) ductal carcinoma in situ ER positive status post wide local excision in 64 year old female with prior history of right breast cancer status post concurrent chemoradiation  PLAN: At this time I recommended whole breast radiation to her left breast her breast is large and pendulous which would make hypofractionated course of treatment difficult.  I would recommend whole breast radiation to 5040 cGy in 28 fractions would boost her scar another 1400 cGy using electron beam.  Risks and benefits of treatment including skin reaction fatigue alteration of blood counts possible inclusion of superficial lung all were described in detail to the patient.  She comprehends my treatment plan well.  She also would benefit from antiestrogen therapy after completion of radiation.  I have personally set up and ordered CT simulation in about a week's time to allow some more healing.  I would like to take this opportunity to thank you for allowing me to participate in the care of your patient.Noreene Filbert, MD

## 2020-03-17 NOTE — Progress Notes (Signed)
Patient is here today for follow up she is doing well no complaints  

## 2020-03-20 NOTE — Progress Notes (Signed)
Hematology/Oncology Consult note [facility]  [phone] [phone]  Patient Care Team: Dion Body, MD as PCP - General (Family Medicine) Theodore Demark, RN as Registered Nurse Rico Junker, RN as Registered Nurse Noreene Filbert, MD as Radiation Oncologist (Radiation Oncology)   Name of the patient: Margaret Douglas  [id number]  1956-04-27   Date of visit: 03/20/20  Diagnosis-left breast DCIS ER positive  Chief complaint/ Reason for visit-discuss pathology results and further management  Heme/Onc history: Patient is a 64 year old female with prior history of her right breast cancer stage III that was treated with lumpectomy and adjuvant radiation treatment and chemotherapy back in 2017.    Tumor was T2N1M mild grade 3 invasive ductal carcinoma s/p lumpectomy and sentinel lymph node biopsy in January 2007.  3.7 cm ER positive, PR negative and HER-2 positive.  She completed AC Taxol in August 2007.  Herceptin was not given due to decrease in ejection fraction post anthracycline.  Ejection fraction went down to 45.4% from 60.1%.  She also received 5 years of antiestrogen therapy.  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    Findings biopsy showed focal residual DCIS.  Negative for invasive carcinoma.  1 lymph node negative for metastatic carcinoma.  pTis pN 0   Interval history-patient is doing well since her surgery and denies any complaints at this time  ECOG PS- 0 Pain scale- 0   Review of systems- Review of Systems  Constitutional: Negative for chills, fever, malaise/fatigue and weight loss.  HENT: Negative for congestion, ear discharge and nosebleeds.   Eyes: Negative for blurred vision.  Respiratory: Negative for cough, hemoptysis, sputum production, shortness of breath and  wheezing.   Cardiovascular: Negative for chest pain, palpitations, orthopnea and claudication.  Gastrointestinal: Negative for abdominal pain, blood in stool, constipation, diarrhea, heartburn, melena, nausea and vomiting.  Genitourinary: Negative for dysuria, flank pain, frequency, hematuria and urgency.  Musculoskeletal: Negative for back pain, joint pain and myalgias.  Skin: Negative for rash.  Neurological: Negative for dizziness, tingling, focal weakness, seizures, weakness and headaches.  Endo/Heme/Allergies: Does not bruise/bleed easily.  Psychiatric/Behavioral: Negative for depression and suicidal ideas. The patient does not have insomnia.        No Known Allergies   Past Medical History:  Diagnosis Date  . Breast cancer ([place]) 2007   RT LUMPECTOMY  . Hypertension   . Hypothyroidism   . Personal history of chemotherapy   . Personal history of radiation therapy   . Pre-diabetes      Past Surgical History:  Procedure Laterality Date  . BREAST BIOPSY Right 2007   breast ca rad and chemo  . BREAST BIOPSY Left 02/14/2020   Affirm bx-"x" clip-DUCTAL CARCINOMA IN SITU, NUCLEAR GRADE 2-3, WITH COMEDONECROSIS  . BREAST LUMPECTOMY    . PARTIAL MASTECTOMY WITH NEEDLE LOCALIZATION AND AXILLARY SENTINEL LYMPH NODE BX Left 03/06/2020   Procedure: PARTIAL MASTECTOMY WITH NEEDLE LOCALIZATION AND AXILLARY SENTINEL LYMPH NODE BX;  Surgeon: Herbert Pun, MD;  Location: [facility] ORS;  Service: General;  Laterality: Left;    Social History   Socioeconomic History  . Marital status: Divorced    Spouse name: Not on file  . Number of children: Not on file  . Years of education: Not on file  . Highest education level: Not on file  Occupational History  . Not  on file  Tobacco Use  . Smoking status: Current Every Day Smoker    Packs/day: 0.50    Years: 35.00    Pack years: 17.50    Types: Cigarettes  . Smokeless tobacco: Never Used  Substance and Sexual Activity  . Alcohol  use: Yes    Alcohol/week: 1.0 standard drinks    Types: 1 Standard drinks or equivalent per week    Comment: daily  . Drug use: Not Currently  . Sexual activity: Not on file  Other Topics Concern  . Not on file  Social History Narrative  . Not on file   Social Determinants of Health   Financial Resource Strain:   . Difficulty of Paying Living Expenses:   Food Insecurity:   . Worried About Charity fundraiser in the Last Year:   . Arboriculturist in the Last Year:   Transportation Needs:   . Film/video editor (Medical):   Marland Kitchen Lack of Transportation (Non-Medical):   Physical Activity:   . Days of Exercise per Week:   . Minutes of Exercise per Session:   Stress:   . Feeling of Stress :   Social Connections:   . Frequency of Communication with Friends and Family:   . Frequency of Social Gatherings with Friends and Family:   . Attends Religious Services:   . Active Member of Clubs or Organizations:   . Attends Archivist Meetings:   Marland Kitchen Marital Status:   Intimate Partner Violence:   . Fear of Current or Ex-Partner:   . Emotionally Abused:   Marland Kitchen Physically Abused:   . Sexually Abused:     Family History  Problem Relation Age of Onset  . COPD Mother   . Healthy Father   . Breast cancer Neg Hx      Current Outpatient Medications:  .  amLODipine (NORVASC) 5 MG tablet, Take 5 mg by mouth daily. , Disp: , Rfl:  .  Aspirin-Salicylamide-Caffeine (BC HEADACHE POWDER PO), Take 1-2 packets by mouth 2 (two) times daily as needed (pain). , Disp: , Rfl:  .  atorvastatin (LIPITOR) 40 MG tablet, Take 40 mg by mouth daily. , Disp: , Rfl:  .  levothyroxine (SYNTHROID) 150 MCG tablet, Take 150 mcg by mouth daily. , Disp: , Rfl:  .  letrozole (FEMARA) 2.5 MG tablet, Take 1 tablet (2.5 mg total) by mouth daily., Disp: 30 tablet, Rfl: 3  Physical exam: There were no vitals filed for this visit. Physical Exam Constitutional:      General: She is not in acute  distress. Cardiovascular:     Rate and Rhythm: Normal rate and regular rhythm.     Heart sounds: Normal heart sounds.  Pulmonary:     Effort: Pulmonary effort is normal.     Breath sounds: Normal breath sounds.  Skin:    General: Skin is warm and dry.  Neurological:     Mental Status: She is alert and oriented to person, place, and time.   Patient is s/p left lumpectomy.  Surgical scar is healing well.  NM SENTINEL NODE INJECTION  Result Date: 03/06/2020 CLINICAL DATA:  Left breast cancer. EXAM: NUCLEAR MEDICINE BREAST LYMPHOSCINTIGRAPHY TECHNIQUE: Intradermal injection of radiopharmaceutical was performed at the 12 o'clock, 3 o'clock, 6 o'clock, and 9 o'clock positions around the left nipple. The patient was then sent to the operating room where the sentinel node(s) were identified and removed by the surgeon. RADIOPHARMACEUTICALS:  Total of 0.82 mCi Millipore-filtered Technetium-3msulfur  colloid. IMPRESSION: Uncomplicated intradermal injection of Technetium-44msulfur colloid for purposes of sentinel node identification. Electronically Signed   By: AMarkus DaftM.D.   On: 03/06/2020 09:44   MM Breast Surgical Specimen  Result Date: 03/06/2020 CLINICAL DATA:  64year old female underwent lumpectomy of the left breast for recently diagnosed DCIS. EXAM: SPECIMEN RADIOGRAPH OF THE LEFT BREAST COMPARISON:  Previous exam(s). FINDINGS: Status post excision of the left breast. The RF tag and biopsy marker clip are present and are marked for pathology. IMPRESSION: Specimen radiograph of the left breast. Electronically Signed   By: NAudie PintoM.D.   On: 03/06/2020 15:55   MM LT RADIO FREQUENCY TAG LOC MAMMO GUIDE  Result Date: 03/06/2020 CLINICAL DATA:  64year old female presenting for left breast localization prior to surgery. History of recently diagnosed DCIS. EXAM: MAMMOGRAPHIC GUIDED RADIOFREQUENCY LOCALIZATION OF THE LEFT BREAST COMPARISON:  Previous exam(s). FINDINGS: Patient presents  for RF tag localization prior to left breast surgery. I met with the patient and we discussed the procedure of localization including benefits and alternatives. We discussed the high likelihood of a successful procedure. We discussed the risks of the procedure including infection, bleeding, tissue injury and further surgery. Informed, written consent was given. The usual time-out protocol was performed immediately prior to the procedure. Using mammographic guidance, sterile technique, 1% lidocaine and a RF tag, the calcifications and biopsy clip in the left breast was localized using a lateral approach. The follow-up mammogram images confirm the seed in the expected location and were marked for Dr. CPeyton Najjar The patient tolerated the procedure well. IMPRESSION: RF localization of the left breast.  No apparent complications. Electronically Signed   By: NAudie PintoM.D.   On: 03/06/2020 08:36     Assessment and plan- Patient is a 64y.o. female with left breast DCIS ER positive here to discuss final pathology and further management  Patient underwent lumpectomy and sentinel lymph node biopsy which showed small area of residual DCIS.  No evidence of invasive mammary carcinoma.  Margins negative.  Patient can proceed with adjuvant radiation treatment at this time.  Given that her tumor was ER PR positive hormone therapy is indicated at this time.  Idiscussed the role for hormone therapy. Given that she is postmenopausal I would favor 5 years of adjuvant hormone therapy with aromatase inhibitor. I discussed the risks and benefits of letrozole including all but not limited to fatigue, hypercholesterolemia, hot flashes, arthralgias and worsening bone health.  Patient will also need to be on calcium 1200 mg along with vitamin D 800 international units.  We will obtain a baseline bone density scan written information about Arimidex given to the patient. I would like her to finish radiation therapy and start  hormone therapy thereafter. Patient verbalized understanding and agrees to proceed.  Treatment will be given with a curative intent  Cancer Staging Ductal carcinoma in situ (DCIS) of left breast Staging form: Breast, AJCC 8th Edition - Pathologic stage from 03/17/2020: Stage 0 (pTis (DCIS), pN0, cM0, ER+) - Signed by RSindy Guadeloupe MD on 03/20/2020     Visit Diagnosis 1. Ductal carcinoma in situ (DCIS) of left breast   2. Goals of care, counseling/discussion      Dr. ARanda Evens MD, MPH [id number]/11/2020 1:06 PM

## 2020-03-27 ENCOUNTER — Inpatient Hospital Stay (HOSPITAL_BASED_OUTPATIENT_CLINIC_OR_DEPARTMENT_OTHER): Payer: PRIVATE HEALTH INSURANCE | Admitting: Genetic Counselor

## 2020-03-27 ENCOUNTER — Encounter: Payer: Self-pay | Admitting: Genetic Counselor

## 2020-03-27 ENCOUNTER — Ambulatory Visit
Admission: RE | Admit: 2020-03-27 | Discharge: 2020-03-27 | Disposition: A | Discharge status: Home / Self Care | Payer: PRIVATE HEALTH INSURANCE | Source: Ambulatory Visit | Attending: Radiation Oncology | Admitting: Radiation Oncology

## 2020-03-27 DIAGNOSIS — D0512 Intraductal carcinoma in situ of left breast: Secondary | ICD-10-CM

## 2020-03-27 DIAGNOSIS — Z853 Personal history of malignant neoplasm of breast: Secondary | ICD-10-CM

## 2020-03-27 DIAGNOSIS — Z510 Encounter for antineoplastic radiation therapy: Secondary | ICD-10-CM | POA: Diagnosis not present

## 2020-03-27 NOTE — Progress Notes (Signed)
REFERRING PROVIDER: Sindy Guadeloupe, MD [place],  [place] [address]  PRIMARY PROVIDER:  Dion Body, MD  PRIMARY REASON FOR VISIT:  1. Ductal carcinoma in situ (DCIS) of left breast   2. History of breast cancer      I connected with Ms. Stanback on 03/27/2020 at 1:10 pm EDT by MyChart video conference and verified that I am speaking with the correct person using two identifiers.   Patient location: Home Provider location: [facility] office  HISTORY OF PRESENT ILLNESS:   Ms. Gainey, a 64 y.o. female, was seen for a [facility] cancer genetics consultation at the request of Dr. Janese Banks due to a personal history of bilateral breast cancer.  Ms. Theall presents to clinic today to discuss the possibility of a hereditary predisposition to cancer, genetic testing, and to further clarify her future cancer risks, as well as potential cancer risks for family members.   In 2007, at the age of 49, Ms. 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 The treatment plan included chemotherapy, lumpectomy, radiation therapy, and antiestrogen therapy. In February of 2021, she was diagnosed with ductal carcinoma in situ (ER+/PR+) of the left breast. Her treatment plan includes surgery, radiation therapy, and hormone therapy.   CANCER HISTORY:  Oncology History  Ductal carcinoma in situ (DCIS) of left breast  02/26/2020 Initial Diagnosis   Ductal carcinoma in situ (DCIS) of left breast   03/17/2020 Cancer Staging   Staging form: Breast, AJCC 8th Edition - Pathologic stage from 03/17/2020: Stage 0 (pTis (DCIS), pN0, cM0, ER+) - Signed by Sindy Guadeloupe, MD on 03/20/2020     RISK FACTORS:  Menarche was at unknown age.  First live birth at age 20.  OCP use for approximately 30+ years.  Ovaries intact: yes.  Hysterectomy: no.  Menopausal status: postmenopausal.  HRT use: 0 years. Colonoscopy: yes; normal per patient. Mammogram within the last year:  yes. Any excessive radiation exposure in the past: history of radiation treatment to right breast  Past Medical History:  Diagnosis Date  . Breast cancer ([place]) 2007   RT LUMPECTOMY  . Hypertension   . Hypothyroidism   . Personal history of chemotherapy   . Personal history of radiation therapy   . Pre-diabetes     Past Surgical History:  Procedure Laterality Date  . BREAST BIOPSY Right 2007   breast ca rad and chemo  . BREAST BIOPSY Left 02/14/2020   Affirm bx-"x" clip-DUCTAL CARCINOMA IN SITU, NUCLEAR GRADE 2-3, WITH COMEDONECROSIS  . BREAST LUMPECTOMY    . PARTIAL MASTECTOMY WITH NEEDLE LOCALIZATION AND AXILLARY SENTINEL LYMPH NODE BX Left 03/06/2020   Procedure: PARTIAL MASTECTOMY WITH NEEDLE LOCALIZATION AND AXILLARY SENTINEL LYMPH NODE BX;  Surgeon: Herbert Pun, MD;  Location: [facility] ORS;  Service: General;  Laterality: Left;    Social History   Socioeconomic History  . Marital status: Divorced    Spouse name: Not on file  . Number of children: Not on file  . Years of education: Not on file  . Highest education level: Not on file  Occupational History  . Not on file  Tobacco Use  . Smoking status: Current Every Day Smoker    Packs/day: 0.50    Years: 35.00    Pack years: 17.50    Types: Cigarettes  . Smokeless tobacco: Never Used  Substance and Sexual Activity  . Alcohol use: Yes    Alcohol/week: 1.0 standard drinks    Types: 1  Standard drinks or equivalent per week    Comment: daily  . Drug use: Not Currently  . Sexual activity: Not on file  Other Topics Concern  . Not on file  Social History Narrative  . Not on file   Social Determinants of Health   Financial Resource Strain:   . Difficulty of Paying Living Expenses:   Food Insecurity:   . Worried About Charity fundraiser in the Last Year:   . Arboriculturist in the Last Year:   Transportation Needs:   . Film/video editor (Medical):   Marland Kitchen Lack of Transportation (Non-Medical):    Physical Activity:   . Days of Exercise per Week:   . Minutes of Exercise per Session:   Stress:   . Feeling of Stress :   Social Connections:   . Frequency of Communication with Friends and Family:   . Frequency of Social Gatherings with Friends and Family:   . Attends Religious Services:   . Active Member of Clubs or Organizations:   . Attends Archivist Meetings:   Marland Kitchen Marital Status:      FAMILY HISTORY:  We obtained a detailed, 4-generation family history.  Significant diagnoses are listed below: Family History  Problem Relation Age of Onset  . COPD Mother   . Healthy Father   . Suicidality Brother 22  . Breast cancer Neg Hx    Ms. Vultaggio has one son who is 54 and has not had cancer, although he does have a colonoscopy every 2 years for rectal bleeding. She has one brother who died from suicide at the age of 11, and two sisters who are 33 and 88 and have health problems, but no cancer to Ms. Matich's knowledge.  Ms. Schmiesing mother died at the age of 23 and did not have cancer. Ms. Garmany had four maternal uncles and two maternal aunts, all of whom died in their 53s or older and did not have cancer. Her maternal grandparents died in their 61s or 28s. There are no known diagnoses of cancer on the maternal side of the family.  Ms. Gellis father was killed at the age of 52 and did not have cancer. She had four paternal aunts and four paternal uncles, all of whom died when they were older than 99. Her paternal grandparents died older than 66. There are no known diagnoses of cancer on the paternal side of the family.  Ms. Matsunaga is unaware of previous family history of genetic testing for hereditary cancer risks. Her maternal ancestors are of Native American descent, and paternal ancestors are of [place] descent. There is no reported Ashkenazi Jewish ancestry. There is no known consanguinity.  GENETIC COUNSELING ASSESSMENT: Ms. Winnick is a 64 y.o. female with a personal  history of bilateral breast cancer, first diagnosed at age 72, which is somewhat suggestive of a hereditary cancer syndrome and predisposition to cancer. We, therefore, discussed and recommended the following at today's visit.   DISCUSSION: We discussed that 5-10% of breast cancer is hereditary, with most cases associated with the BRCA1 and BRCA2 genes.  There are other genes that can be associated with hereditary breast cancer syndromes.  We discussed that testing can be beneficial for several reasons, including knowing about other cancer risks, identifying potential screening and risk-reduction options that may be appropriate, and to understand if other family members could be at risk for cancer and allow them to undergo genetic testing.   We reviewed the characteristics, features  and inheritance patterns of hereditary cancer syndromes. We also discussed genetic testing, including the appropriate family members to test, the process of testing, and insurance coverage. We discussed the implications of a negative vs a positive result. We recommended Ms. Kasa pursue genetic testing for a hereditary cancer panel that includes known breast cancer genes.   Based on Ms. Bloyd's personal history of cancer, she meets medical criteria for genetic testing. Despite that she meets criteria, she may still have an out of pocket cost.   PLAN: Despite our recommendation, Ms. Moeser did not wish to pursue genetic testing at today's visit. We understand this decision and remain available to coordinate genetic testing at any time in the future. We, therefore, recommend Ms. Swor continue to follow the cancer screening guidelines given by her primary healthcare provider.  Ms. Rusk questions were answered to her satisfaction today. Our contact information was provided should additional questions or concerns arise. Thank you for the referral and allowing [place] to share in the care of your patient.   Clint Guy, [place],  [facility] Licensed, Certified Dispensing [email or web address] Phone: [phone]  The patient was seen for a total of 25 minutes in face-to-face genetic counseling.  This patient was discussed with Drs. Magrinat, Lindi Adie and/or Burr Medico who agrees with the above.    _______________________________________________________________________ For Office Staff:  Number of people involved in session: 1 Was an Intern/ student involved with case: no

## 2020-03-29 DIAGNOSIS — Z510 Encounter for antineoplastic radiation therapy: Secondary | ICD-10-CM | POA: Diagnosis not present

## 2020-03-30 ENCOUNTER — Encounter: Payer: Self-pay | Admitting: *Deleted

## 2020-03-30 NOTE — Progress Notes (Signed)
Left voicemail for patient to call if she has any questions or needs at this time.  She is starting her radiation therapy.

## 2020-03-31 ENCOUNTER — Other Ambulatory Visit: Payer: Self-pay | Admitting: *Deleted

## 2020-03-31 DIAGNOSIS — D0512 Intraductal carcinoma in situ of left breast: Secondary | ICD-10-CM

## 2020-04-03 ENCOUNTER — Ambulatory Visit: Payer: PRIVATE HEALTH INSURANCE

## 2020-04-04 ENCOUNTER — Ambulatory Visit: Payer: PRIVATE HEALTH INSURANCE

## 2020-04-04 ENCOUNTER — Ambulatory Visit: Admission: RE | Admit: 2020-04-04 | Payer: PRIVATE HEALTH INSURANCE | Source: Ambulatory Visit

## 2020-04-04 DIAGNOSIS — Z510 Encounter for antineoplastic radiation therapy: Secondary | ICD-10-CM | POA: Diagnosis not present

## 2020-04-05 ENCOUNTER — Ambulatory Visit
Admission: RE | Admit: 2020-04-05 | Discharge: 2020-04-05 | Disposition: A | Discharge status: Home / Self Care | Payer: PRIVATE HEALTH INSURANCE | Source: Ambulatory Visit | Attending: Radiation Oncology | Admitting: Radiation Oncology

## 2020-04-05 DIAGNOSIS — Z510 Encounter for antineoplastic radiation therapy: Secondary | ICD-10-CM | POA: Diagnosis not present

## 2020-04-06 ENCOUNTER — Ambulatory Visit
Admission: RE | Admit: 2020-04-06 | Discharge: 2020-04-06 | Disposition: A | Discharge status: Home / Self Care | Payer: PRIVATE HEALTH INSURANCE | Source: Ambulatory Visit | Attending: Radiation Oncology | Admitting: Radiation Oncology

## 2020-04-06 DIAGNOSIS — Z510 Encounter for antineoplastic radiation therapy: Secondary | ICD-10-CM | POA: Diagnosis not present

## 2020-04-07 ENCOUNTER — Ambulatory Visit
Admission: RE | Admit: 2020-04-07 | Discharge: 2020-04-07 | Disposition: A | Discharge status: Home / Self Care | Payer: PRIVATE HEALTH INSURANCE | Source: Ambulatory Visit | Attending: Radiation Oncology | Admitting: Radiation Oncology

## 2020-04-07 DIAGNOSIS — Z510 Encounter for antineoplastic radiation therapy: Secondary | ICD-10-CM | POA: Diagnosis not present

## 2020-04-10 ENCOUNTER — Ambulatory Visit
Admission: RE | Admit: 2020-04-10 | Discharge: 2020-04-10 | Disposition: A | Discharge status: Home / Self Care | Payer: PRIVATE HEALTH INSURANCE | Source: Ambulatory Visit | Attending: Radiation Oncology | Admitting: Radiation Oncology

## 2020-04-10 DIAGNOSIS — D0512 Intraductal carcinoma in situ of left breast: Secondary | ICD-10-CM | POA: Insufficient documentation

## 2020-04-10 DIAGNOSIS — Z510 Encounter for antineoplastic radiation therapy: Secondary | ICD-10-CM | POA: Insufficient documentation

## 2020-04-11 ENCOUNTER — Encounter: Payer: Self-pay | Admitting: *Deleted

## 2020-04-11 ENCOUNTER — Ambulatory Visit
Admission: RE | Admit: 2020-04-11 | Discharge: 2020-04-11 | Disposition: A | Discharge status: Home / Self Care | Payer: PRIVATE HEALTH INSURANCE | Source: Ambulatory Visit | Attending: Radiation Oncology | Admitting: Radiation Oncology

## 2020-04-11 DIAGNOSIS — D0512 Intraductal carcinoma in situ of left breast: Secondary | ICD-10-CM | POA: Diagnosis not present

## 2020-04-12 ENCOUNTER — Ambulatory Visit
Admission: RE | Admit: 2020-04-12 | Discharge: 2020-04-12 | Disposition: A | Discharge status: Home / Self Care | Payer: PRIVATE HEALTH INSURANCE | Source: Ambulatory Visit | Attending: Radiation Oncology | Admitting: Radiation Oncology

## 2020-04-12 DIAGNOSIS — D0512 Intraductal carcinoma in situ of left breast: Secondary | ICD-10-CM | POA: Diagnosis not present

## 2020-04-13 ENCOUNTER — Ambulatory Visit
Admission: RE | Admit: 2020-04-13 | Discharge: 2020-04-13 | Disposition: A | Discharge status: Home / Self Care | Payer: PRIVATE HEALTH INSURANCE | Source: Ambulatory Visit | Attending: Radiation Oncology | Admitting: Radiation Oncology

## 2020-04-13 DIAGNOSIS — D0512 Intraductal carcinoma in situ of left breast: Secondary | ICD-10-CM | POA: Diagnosis not present

## 2020-04-14 ENCOUNTER — Ambulatory Visit
Admission: RE | Admit: 2020-04-14 | Discharge: 2020-04-14 | Disposition: A | Discharge status: Home / Self Care | Payer: PRIVATE HEALTH INSURANCE | Source: Ambulatory Visit | Attending: Radiation Oncology | Admitting: Radiation Oncology

## 2020-04-14 DIAGNOSIS — D0512 Intraductal carcinoma in situ of left breast: Secondary | ICD-10-CM | POA: Diagnosis not present

## 2020-04-17 ENCOUNTER — Ambulatory Visit
Admission: RE | Admit: 2020-04-17 | Discharge: 2020-04-17 | Disposition: A | Discharge status: Home / Self Care | Payer: PRIVATE HEALTH INSURANCE | Source: Ambulatory Visit | Attending: Radiation Oncology | Admitting: Radiation Oncology

## 2020-04-17 DIAGNOSIS — D0512 Intraductal carcinoma in situ of left breast: Secondary | ICD-10-CM | POA: Diagnosis not present

## 2020-04-18 ENCOUNTER — Ambulatory Visit
Admission: RE | Admit: 2020-04-18 | Discharge: 2020-04-18 | Disposition: A | Discharge status: Home / Self Care | Payer: PRIVATE HEALTH INSURANCE | Source: Ambulatory Visit | Attending: Radiation Oncology | Admitting: Radiation Oncology

## 2020-04-18 DIAGNOSIS — D0512 Intraductal carcinoma in situ of left breast: Secondary | ICD-10-CM | POA: Diagnosis not present

## 2020-04-19 ENCOUNTER — Other Ambulatory Visit: Payer: Self-pay

## 2020-04-19 ENCOUNTER — Ambulatory Visit
Admission: RE | Admit: 2020-04-19 | Discharge: 2020-04-19 | Disposition: A | Discharge status: Home / Self Care | Payer: PRIVATE HEALTH INSURANCE | Source: Ambulatory Visit | Attending: Radiation Oncology | Admitting: Radiation Oncology

## 2020-04-19 ENCOUNTER — Inpatient Hospital Stay: Payer: PRIVATE HEALTH INSURANCE | Attending: Oncology

## 2020-04-19 DIAGNOSIS — D0512 Intraductal carcinoma in situ of left breast: Secondary | ICD-10-CM | POA: Insufficient documentation

## 2020-04-19 LAB — CBC
HCT: 40.4 % (ref 36.0–46.0)
Hemoglobin: 14 g/dL (ref 12.0–15.0)
MCH: 32.9 pg (ref 26.0–34.0)
MCHC: 34.7 g/dL (ref 30.0–36.0)
MCV: 95.1 fL (ref 80.0–100.0)
Platelets: 319 10*3/uL (ref 150–400)
RBC: 4.25 MIL/uL (ref 3.87–5.11)
RDW: 13.3 % (ref 11.5–15.5)
WBC: 8.2 10*3/uL (ref 4.0–10.5)
nRBC: 0 % (ref 0.0–0.2)

## 2020-04-20 ENCOUNTER — Ambulatory Visit
Admission: RE | Admit: 2020-04-20 | Discharge: 2020-04-20 | Disposition: A | Discharge status: Home / Self Care | Payer: PRIVATE HEALTH INSURANCE | Source: Ambulatory Visit | Attending: Radiation Oncology | Admitting: Radiation Oncology

## 2020-04-20 DIAGNOSIS — D0512 Intraductal carcinoma in situ of left breast: Secondary | ICD-10-CM | POA: Diagnosis not present

## 2020-04-21 ENCOUNTER — Ambulatory Visit
Admission: RE | Admit: 2020-04-21 | Discharge: 2020-04-21 | Disposition: A | Discharge status: Home / Self Care | Payer: PRIVATE HEALTH INSURANCE | Source: Ambulatory Visit | Attending: Radiation Oncology | Admitting: Radiation Oncology

## 2020-04-21 DIAGNOSIS — D0512 Intraductal carcinoma in situ of left breast: Secondary | ICD-10-CM | POA: Diagnosis not present

## 2020-04-23 ENCOUNTER — Ambulatory Visit: Payer: PRIVATE HEALTH INSURANCE

## 2020-04-24 ENCOUNTER — Ambulatory Visit
Admission: RE | Admit: 2020-04-24 | Discharge: 2020-04-24 | Disposition: A | Discharge status: Home / Self Care | Payer: PRIVATE HEALTH INSURANCE | Source: Ambulatory Visit | Attending: Radiation Oncology | Admitting: Radiation Oncology

## 2020-04-24 DIAGNOSIS — D0512 Intraductal carcinoma in situ of left breast: Secondary | ICD-10-CM | POA: Diagnosis not present

## 2020-04-25 ENCOUNTER — Ambulatory Visit
Admission: RE | Admit: 2020-04-25 | Discharge: 2020-04-25 | Disposition: A | Discharge status: Home / Self Care | Payer: PRIVATE HEALTH INSURANCE | Source: Ambulatory Visit | Attending: Radiation Oncology | Admitting: Radiation Oncology

## 2020-04-25 DIAGNOSIS — D0512 Intraductal carcinoma in situ of left breast: Secondary | ICD-10-CM | POA: Diagnosis not present

## 2020-04-26 ENCOUNTER — Ambulatory Visit
Admission: RE | Admit: 2020-04-26 | Discharge: 2020-04-26 | Disposition: A | Discharge status: Home / Self Care | Payer: PRIVATE HEALTH INSURANCE | Source: Ambulatory Visit | Attending: Radiation Oncology | Admitting: Radiation Oncology

## 2020-04-26 DIAGNOSIS — D0512 Intraductal carcinoma in situ of left breast: Secondary | ICD-10-CM | POA: Diagnosis not present

## 2020-04-27 ENCOUNTER — Ambulatory Visit
Admission: RE | Admit: 2020-04-27 | Discharge: 2020-04-27 | Disposition: A | Discharge status: Home / Self Care | Payer: PRIVATE HEALTH INSURANCE | Source: Ambulatory Visit | Attending: Radiation Oncology | Admitting: Radiation Oncology

## 2020-04-27 DIAGNOSIS — D0512 Intraductal carcinoma in situ of left breast: Secondary | ICD-10-CM | POA: Diagnosis not present

## 2020-04-28 ENCOUNTER — Ambulatory Visit
Admission: RE | Admit: 2020-04-28 | Discharge: 2020-04-28 | Disposition: A | Discharge status: Home / Self Care | Payer: PRIVATE HEALTH INSURANCE | Source: Ambulatory Visit | Attending: Radiation Oncology | Admitting: Radiation Oncology

## 2020-04-28 ENCOUNTER — Ambulatory Visit: Payer: PRIVATE HEALTH INSURANCE

## 2020-04-28 DIAGNOSIS — D0512 Intraductal carcinoma in situ of left breast: Secondary | ICD-10-CM | POA: Diagnosis not present

## 2020-05-01 ENCOUNTER — Ambulatory Visit
Admission: RE | Admit: 2020-05-01 | Discharge: 2020-05-01 | Disposition: A | Discharge status: Home / Self Care | Payer: PRIVATE HEALTH INSURANCE | Source: Ambulatory Visit | Attending: Radiation Oncology | Admitting: Radiation Oncology

## 2020-05-01 DIAGNOSIS — D0512 Intraductal carcinoma in situ of left breast: Secondary | ICD-10-CM | POA: Diagnosis not present

## 2020-05-02 ENCOUNTER — Ambulatory Visit
Admission: RE | Admit: 2020-05-02 | Discharge: 2020-05-02 | Disposition: A | Discharge status: Home / Self Care | Payer: PRIVATE HEALTH INSURANCE | Source: Ambulatory Visit | Attending: Radiation Oncology | Admitting: Radiation Oncology

## 2020-05-02 DIAGNOSIS — D0512 Intraductal carcinoma in situ of left breast: Secondary | ICD-10-CM | POA: Diagnosis not present

## 2020-05-03 ENCOUNTER — Other Ambulatory Visit: Payer: Self-pay

## 2020-05-03 ENCOUNTER — Inpatient Hospital Stay: Payer: PRIVATE HEALTH INSURANCE

## 2020-05-03 ENCOUNTER — Ambulatory Visit
Admission: RE | Admit: 2020-05-03 | Discharge: 2020-05-03 | Disposition: A | Discharge status: Home / Self Care | Payer: PRIVATE HEALTH INSURANCE | Source: Ambulatory Visit | Attending: Radiation Oncology | Admitting: Radiation Oncology

## 2020-05-03 DIAGNOSIS — D0512 Intraductal carcinoma in situ of left breast: Secondary | ICD-10-CM

## 2020-05-03 LAB — CBC
HCT: 37.3 % (ref 36.0–46.0)
Hemoglobin: 12.9 g/dL (ref 12.0–15.0)
MCH: 32.7 pg (ref 26.0–34.0)
MCHC: 34.6 g/dL (ref 30.0–36.0)
MCV: 94.7 fL (ref 80.0–100.0)
Platelets: 294 10*3/uL (ref 150–400)
RBC: 3.94 MIL/uL (ref 3.87–5.11)
RDW: 13.2 % (ref 11.5–15.5)
WBC: 9.9 10*3/uL (ref 4.0–10.5)
nRBC: 0 % (ref 0.0–0.2)

## 2020-05-04 ENCOUNTER — Ambulatory Visit
Admission: RE | Admit: 2020-05-04 | Discharge: 2020-05-04 | Disposition: A | Discharge status: Home / Self Care | Payer: PRIVATE HEALTH INSURANCE | Source: Ambulatory Visit | Attending: Radiation Oncology | Admitting: Radiation Oncology

## 2020-05-04 DIAGNOSIS — D0512 Intraductal carcinoma in situ of left breast: Secondary | ICD-10-CM | POA: Diagnosis not present

## 2020-05-05 ENCOUNTER — Ambulatory Visit
Admission: RE | Admit: 2020-05-05 | Discharge: 2020-05-05 | Disposition: A | Discharge status: Home / Self Care | Payer: PRIVATE HEALTH INSURANCE | Source: Ambulatory Visit | Attending: Radiation Oncology | Admitting: Radiation Oncology

## 2020-05-05 ENCOUNTER — Ambulatory Visit: Payer: PRIVATE HEALTH INSURANCE

## 2020-05-05 DIAGNOSIS — D0512 Intraductal carcinoma in situ of left breast: Secondary | ICD-10-CM | POA: Diagnosis not present

## 2020-05-09 ENCOUNTER — Ambulatory Visit
Admission: RE | Admit: 2020-05-09 | Discharge: 2020-05-09 | Disposition: A | Discharge status: Home / Self Care | Payer: PRIVATE HEALTH INSURANCE | Source: Ambulatory Visit | Attending: Radiation Oncology | Admitting: Radiation Oncology

## 2020-05-09 DIAGNOSIS — Z510 Encounter for antineoplastic radiation therapy: Secondary | ICD-10-CM | POA: Insufficient documentation

## 2020-05-09 DIAGNOSIS — D0512 Intraductal carcinoma in situ of left breast: Secondary | ICD-10-CM | POA: Insufficient documentation

## 2020-05-10 ENCOUNTER — Ambulatory Visit
Admission: RE | Admit: 2020-05-10 | Discharge: 2020-05-10 | Disposition: A | Discharge status: Home / Self Care | Payer: PRIVATE HEALTH INSURANCE | Source: Ambulatory Visit | Attending: Radiation Oncology | Admitting: Radiation Oncology

## 2020-05-10 DIAGNOSIS — Z510 Encounter for antineoplastic radiation therapy: Secondary | ICD-10-CM | POA: Diagnosis not present

## 2020-05-11 ENCOUNTER — Ambulatory Visit
Admission: RE | Admit: 2020-05-11 | Discharge: 2020-05-11 | Disposition: A | Discharge status: Home / Self Care | Payer: PRIVATE HEALTH INSURANCE | Source: Ambulatory Visit | Attending: Radiation Oncology | Admitting: Radiation Oncology

## 2020-05-11 DIAGNOSIS — Z510 Encounter for antineoplastic radiation therapy: Secondary | ICD-10-CM | POA: Diagnosis not present

## 2020-05-12 ENCOUNTER — Ambulatory Visit
Admission: RE | Admit: 2020-05-12 | Discharge: 2020-05-12 | Disposition: A | Discharge status: Home / Self Care | Payer: PRIVATE HEALTH INSURANCE | Source: Ambulatory Visit | Attending: Radiation Oncology | Admitting: Radiation Oncology

## 2020-05-12 ENCOUNTER — Ambulatory Visit: Payer: PRIVATE HEALTH INSURANCE

## 2020-05-12 DIAGNOSIS — Z510 Encounter for antineoplastic radiation therapy: Secondary | ICD-10-CM | POA: Diagnosis not present

## 2020-05-15 ENCOUNTER — Ambulatory Visit: Payer: PRIVATE HEALTH INSURANCE

## 2020-05-15 ENCOUNTER — Ambulatory Visit: Admission: RE | Admit: 2020-05-15 | Payer: PRIVATE HEALTH INSURANCE | Source: Ambulatory Visit

## 2020-05-15 DIAGNOSIS — Z510 Encounter for antineoplastic radiation therapy: Secondary | ICD-10-CM | POA: Diagnosis not present

## 2020-05-16 ENCOUNTER — Ambulatory Visit: Payer: PRIVATE HEALTH INSURANCE

## 2020-05-16 ENCOUNTER — Ambulatory Visit
Admission: RE | Admit: 2020-05-16 | Discharge: 2020-05-16 | Disposition: A | Discharge status: Home / Self Care | Payer: PRIVATE HEALTH INSURANCE | Source: Ambulatory Visit | Attending: Radiation Oncology | Admitting: Radiation Oncology

## 2020-05-16 ENCOUNTER — Other Ambulatory Visit: Payer: No Typology Code available for payment source

## 2020-05-16 DIAGNOSIS — Z510 Encounter for antineoplastic radiation therapy: Secondary | ICD-10-CM | POA: Diagnosis not present

## 2020-05-17 ENCOUNTER — Ambulatory Visit: Payer: PRIVATE HEALTH INSURANCE

## 2020-05-17 ENCOUNTER — Inpatient Hospital Stay: Payer: No Typology Code available for payment source | Attending: Oncology

## 2020-05-17 ENCOUNTER — Ambulatory Visit
Admission: RE | Admit: 2020-05-17 | Discharge: 2020-05-17 | Disposition: A | Discharge status: Home / Self Care | Payer: PRIVATE HEALTH INSURANCE | Source: Ambulatory Visit | Attending: Radiation Oncology | Admitting: Radiation Oncology

## 2020-05-17 DIAGNOSIS — Z510 Encounter for antineoplastic radiation therapy: Secondary | ICD-10-CM | POA: Diagnosis not present

## 2020-05-18 ENCOUNTER — Ambulatory Visit
Admission: RE | Admit: 2020-05-18 | Discharge: 2020-05-18 | Disposition: A | Discharge status: Home / Self Care | Payer: No Typology Code available for payment source | Source: Ambulatory Visit | Attending: Oncology | Admitting: Oncology

## 2020-05-18 ENCOUNTER — Ambulatory Visit
Admission: RE | Admit: 2020-05-18 | Discharge: 2020-05-18 | Disposition: A | Discharge status: Home / Self Care | Payer: PRIVATE HEALTH INSURANCE | Source: Ambulatory Visit | Attending: Radiation Oncology | Admitting: Radiation Oncology

## 2020-05-18 ENCOUNTER — Ambulatory Visit: Payer: PRIVATE HEALTH INSURANCE

## 2020-05-18 DIAGNOSIS — D0512 Intraductal carcinoma in situ of left breast: Secondary | ICD-10-CM | POA: Insufficient documentation

## 2020-05-18 DIAGNOSIS — Z510 Encounter for antineoplastic radiation therapy: Secondary | ICD-10-CM | POA: Diagnosis not present

## 2020-05-19 ENCOUNTER — Ambulatory Visit: Payer: PRIVATE HEALTH INSURANCE

## 2020-05-19 ENCOUNTER — Ambulatory Visit
Admission: RE | Admit: 2020-05-19 | Discharge: 2020-05-19 | Disposition: A | Discharge status: Home / Self Care | Payer: PRIVATE HEALTH INSURANCE | Source: Ambulatory Visit | Attending: Radiation Oncology | Admitting: Radiation Oncology

## 2020-05-19 DIAGNOSIS — Z510 Encounter for antineoplastic radiation therapy: Secondary | ICD-10-CM | POA: Diagnosis not present

## 2020-05-22 ENCOUNTER — Ambulatory Visit: Payer: PRIVATE HEALTH INSURANCE

## 2020-05-22 ENCOUNTER — Ambulatory Visit
Admission: RE | Admit: 2020-05-22 | Discharge: 2020-05-22 | Disposition: A | Discharge status: Home / Self Care | Payer: PRIVATE HEALTH INSURANCE | Source: Ambulatory Visit | Attending: Radiation Oncology | Admitting: Radiation Oncology

## 2020-05-22 DIAGNOSIS — Z510 Encounter for antineoplastic radiation therapy: Secondary | ICD-10-CM | POA: Diagnosis not present

## 2020-05-23 ENCOUNTER — Ambulatory Visit
Admission: RE | Admit: 2020-05-23 | Discharge: 2020-05-23 | Disposition: A | Discharge status: Home / Self Care | Payer: PRIVATE HEALTH INSURANCE | Source: Ambulatory Visit | Attending: Radiation Oncology | Admitting: Radiation Oncology

## 2020-05-23 ENCOUNTER — Ambulatory Visit: Payer: PRIVATE HEALTH INSURANCE

## 2020-05-23 DIAGNOSIS — Z510 Encounter for antineoplastic radiation therapy: Secondary | ICD-10-CM | POA: Diagnosis not present

## 2020-05-24 ENCOUNTER — Ambulatory Visit
Admission: RE | Admit: 2020-05-24 | Discharge: 2020-05-24 | Disposition: A | Discharge status: Home / Self Care | Payer: PRIVATE HEALTH INSURANCE | Source: Ambulatory Visit | Attending: Radiation Oncology | Admitting: Radiation Oncology

## 2020-05-24 ENCOUNTER — Ambulatory Visit: Payer: PRIVATE HEALTH INSURANCE

## 2020-05-24 DIAGNOSIS — Z510 Encounter for antineoplastic radiation therapy: Secondary | ICD-10-CM | POA: Diagnosis not present

## 2020-05-25 ENCOUNTER — Ambulatory Visit: Payer: PRIVATE HEALTH INSURANCE

## 2020-05-25 ENCOUNTER — Ambulatory Visit
Admission: RE | Admit: 2020-05-25 | Discharge: 2020-05-25 | Disposition: A | Discharge status: Home / Self Care | Payer: PRIVATE HEALTH INSURANCE | Source: Ambulatory Visit | Attending: Radiation Oncology | Admitting: Radiation Oncology

## 2020-05-25 DIAGNOSIS — Z510 Encounter for antineoplastic radiation therapy: Secondary | ICD-10-CM | POA: Diagnosis not present

## 2020-05-29 ENCOUNTER — Ambulatory Visit: Payer: PRIVATE HEALTH INSURANCE

## 2020-05-30 ENCOUNTER — Ambulatory Visit: Payer: PRIVATE HEALTH INSURANCE

## 2020-05-31 ENCOUNTER — Ambulatory Visit: Payer: PRIVATE HEALTH INSURANCE

## 2020-06-01 ENCOUNTER — Ambulatory Visit: Payer: PRIVATE HEALTH INSURANCE

## 2020-06-08 ENCOUNTER — Encounter: Payer: Self-pay | Admitting: *Deleted

## 2020-06-16 ENCOUNTER — Inpatient Hospital Stay: Payer: No Typology Code available for payment source

## 2020-06-19 ENCOUNTER — Inpatient Hospital Stay: Payer: No Typology Code available for payment source | Admitting: Oncology

## 2020-07-05 ENCOUNTER — Ambulatory Visit: Payer: No Typology Code available for payment source | Admitting: Radiation Oncology

## 2020-10-02 ENCOUNTER — Telehealth: Payer: Self-pay | Admitting: *Deleted

## 2020-10-02 NOTE — Telephone Encounter (Signed)
Called and left patient a voicemail on her home phone.  Patient had a visit in June with Dr. Donella Stade and did finally finished the radiation.  She had a later appointment in July to see Dr. Janese Banks and to have labs a couple days before then.  The patient had called in and canceled saying that she had a lot of things going on but would call back.  We wanted to check up on her because we had not gotten a call back and have not made her a future appointment.  Patient was originally given letrozole to take for her breast cancer.  She will need follow-up for continued letrozole and the monitoring while she is on it.  I have left this message on the patient's voicemail as well is giving him my direct number to call me back so that we can get her set up if she is still wanting to be on letrozole and wanted to be monitored for her breast cancer.

## 2020-11-21 IMAGING — MG MM BREAST LOCALIZATION CLIP
4 series · 4 of 12 positions shown · non-contrast
Comparison: Previous exam(s).

CLINICAL DATA: Post biopsy mammogram of the left breast for clip
placement.

EXAM:
DIAGNOSTIC LEFT MAMMOGRAM POST STEREOTACTIC BIOPSY

[L CC synth-2D]
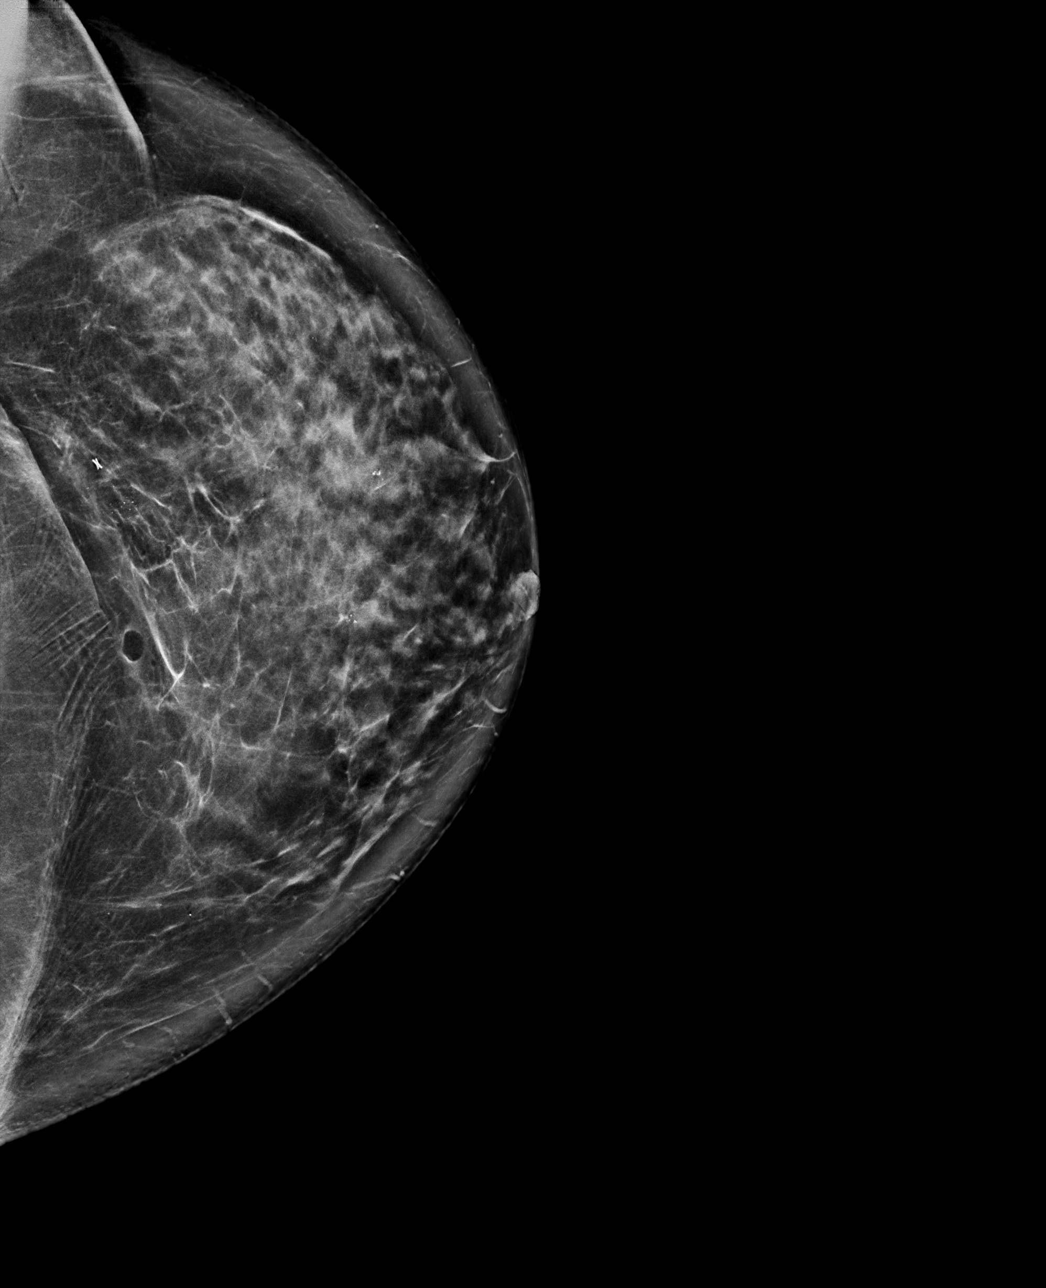

[L LM synth-2D]
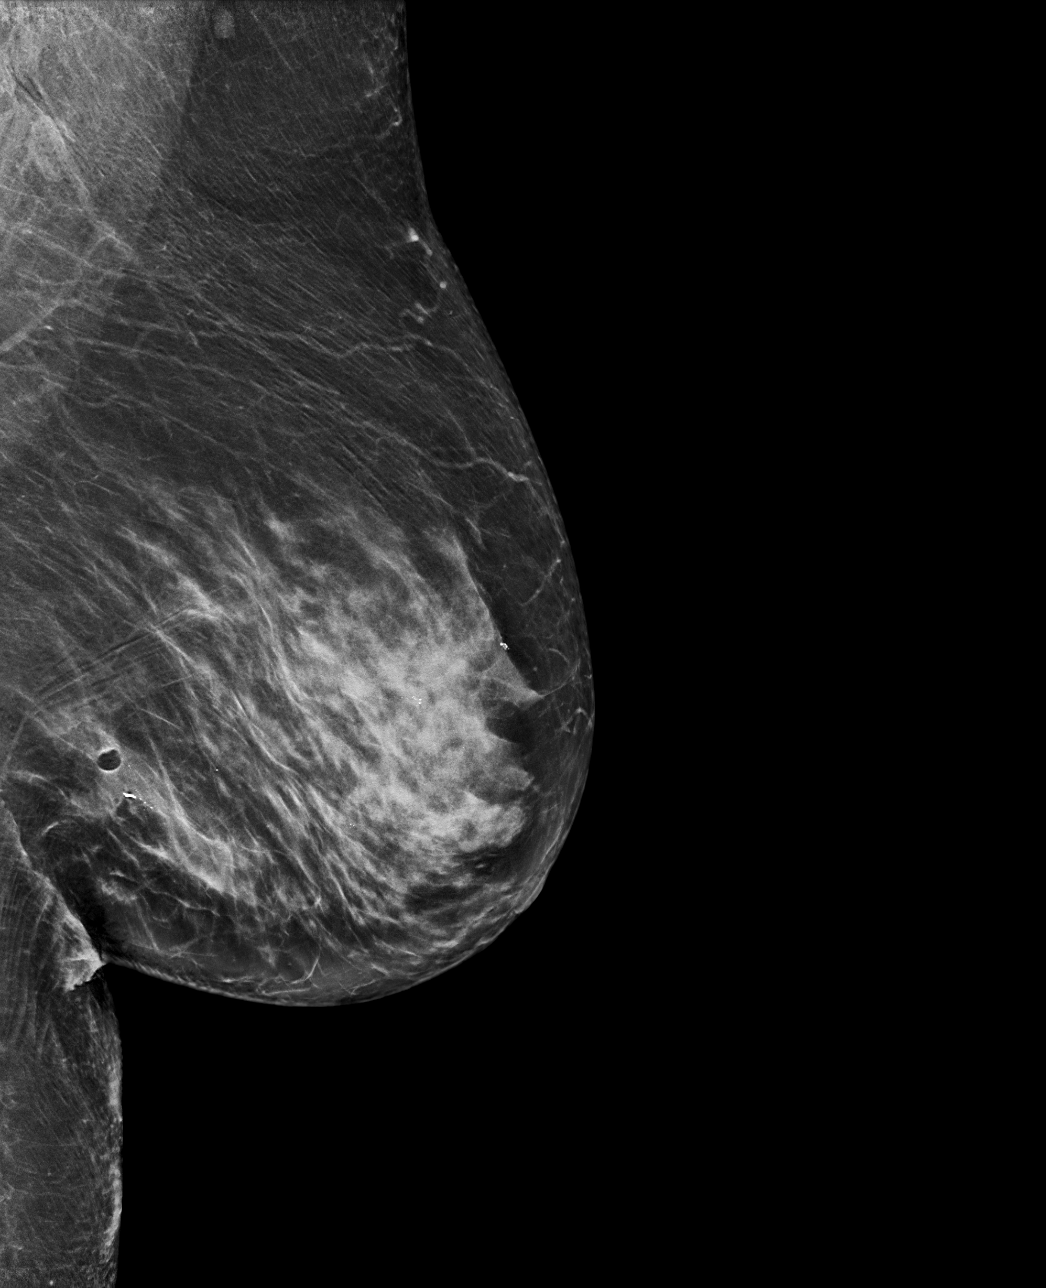

[L LM tomo · tomo slice 41/82.0]
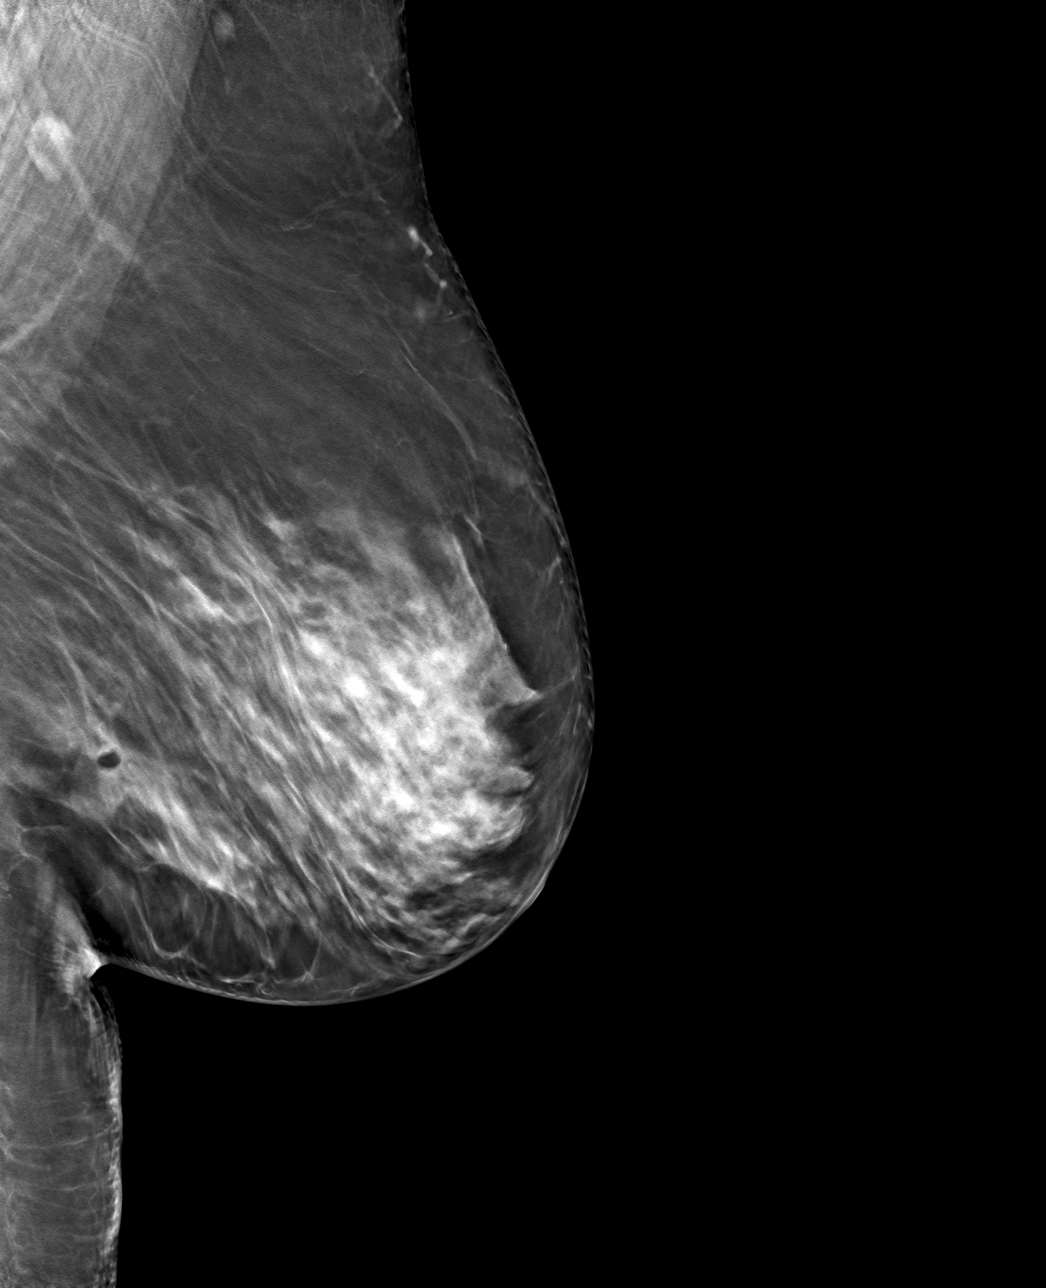

[L CC tomo · tomo slice 41/82.0]
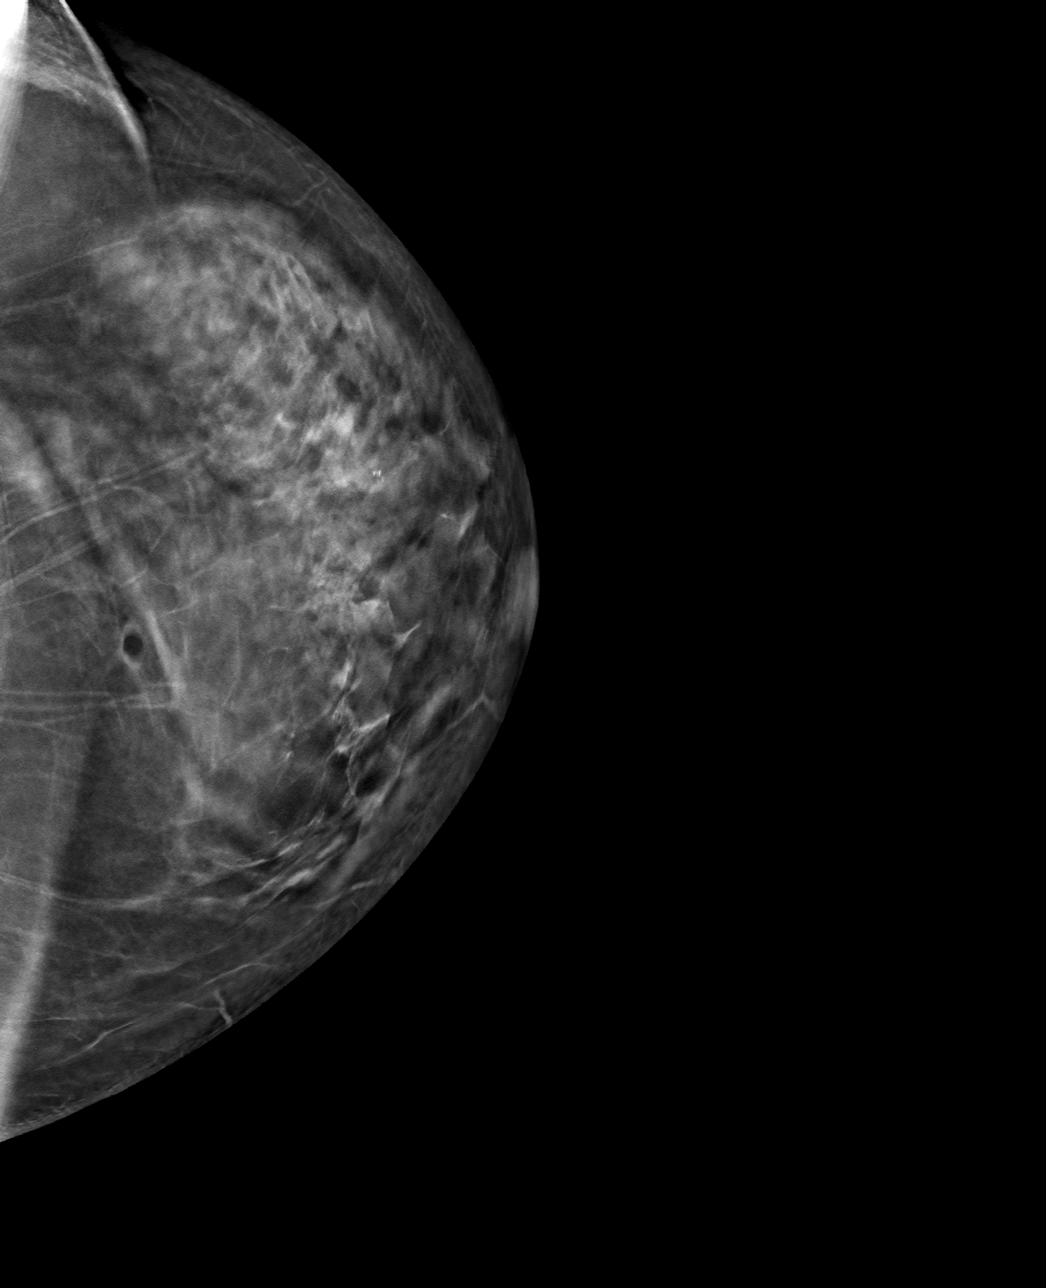

[4 of 12 positions shown; findings below may reference images not displayed]

FINDINGS: Mammographic images were obtained following stereotactic guided
biopsy of calcifications in the lower outer left breast. The biopsy
marking clip is in expected position at the site of biopsy.
IMPRESSION: Appropriate positioning of the X shaped biopsy marking clip at the
site of biopsy in the lower outer left breast.

Final Assessment: Post Procedure Mammograms for Marker Placement

## 2024-04-08 ENCOUNTER — Other Ambulatory Visit: Payer: Self-pay

## 2024-04-08 ENCOUNTER — Inpatient Hospital Stay (HOSPITAL_COMMUNITY)
Admission: EM | Admit: 2024-04-08 | Discharge: 2024-04-15 | DRG: 193 | Disposition: A | Discharge status: Home Health | Attending: Internal Medicine | Admitting: Internal Medicine

## 2024-04-08 ENCOUNTER — Encounter (HOSPITAL_COMMUNITY): Payer: Self-pay | Admitting: Emergency Medicine

## 2024-04-08 ENCOUNTER — Emergency Department (HOSPITAL_COMMUNITY)

## 2024-04-08 DIAGNOSIS — Z79890 Hormone replacement therapy (postmenopausal): Secondary | ICD-10-CM

## 2024-04-08 DIAGNOSIS — K760 Fatty (change of) liver, not elsewhere classified: Secondary | ICD-10-CM | POA: Diagnosis present

## 2024-04-08 DIAGNOSIS — J440 Chronic obstructive pulmonary disease with acute lower respiratory infection: Secondary | ICD-10-CM | POA: Diagnosis present

## 2024-04-08 DIAGNOSIS — E66811 Obesity, class 1: Secondary | ICD-10-CM | POA: Diagnosis present

## 2024-04-08 DIAGNOSIS — T381X6A Underdosing of thyroid hormones and substitutes, initial encounter: Secondary | ICD-10-CM | POA: Diagnosis present

## 2024-04-08 DIAGNOSIS — Z91128 Patient's intentional underdosing of medication regimen for other reason: Secondary | ICD-10-CM

## 2024-04-08 DIAGNOSIS — B9789 Other viral agents as the cause of diseases classified elsewhere: Secondary | ICD-10-CM | POA: Diagnosis present

## 2024-04-08 DIAGNOSIS — F17210 Nicotine dependence, cigarettes, uncomplicated: Secondary | ICD-10-CM | POA: Diagnosis present

## 2024-04-08 DIAGNOSIS — N183 Chronic kidney disease, stage 3 unspecified: Secondary | ICD-10-CM | POA: Insufficient documentation

## 2024-04-08 DIAGNOSIS — E039 Hypothyroidism, unspecified: Secondary | ICD-10-CM | POA: Diagnosis present

## 2024-04-08 DIAGNOSIS — J9601 Acute respiratory failure with hypoxia: Principal | ICD-10-CM | POA: Insufficient documentation

## 2024-04-08 DIAGNOSIS — I130 Hypertensive heart and chronic kidney disease with heart failure and stage 1 through stage 4 chronic kidney disease, or unspecified chronic kidney disease: Secondary | ICD-10-CM | POA: Diagnosis present

## 2024-04-08 DIAGNOSIS — I459 Conduction disorder, unspecified: Secondary | ICD-10-CM | POA: Diagnosis present

## 2024-04-08 DIAGNOSIS — D539 Nutritional anemia, unspecified: Secondary | ICD-10-CM | POA: Insufficient documentation

## 2024-04-08 DIAGNOSIS — Z79899 Other long term (current) drug therapy: Secondary | ICD-10-CM

## 2024-04-08 DIAGNOSIS — I447 Left bundle-branch block, unspecified: Secondary | ICD-10-CM | POA: Diagnosis present

## 2024-04-08 DIAGNOSIS — J1289 Other viral pneumonia: Secondary | ICD-10-CM | POA: Diagnosis not present

## 2024-04-08 DIAGNOSIS — Z853 Personal history of malignant neoplasm of breast: Secondary | ICD-10-CM

## 2024-04-08 DIAGNOSIS — Z9011 Acquired absence of right breast and nipple: Secondary | ICD-10-CM

## 2024-04-08 DIAGNOSIS — J209 Acute bronchitis, unspecified: Secondary | ICD-10-CM | POA: Diagnosis present

## 2024-04-08 DIAGNOSIS — J449 Chronic obstructive pulmonary disease, unspecified: Secondary | ICD-10-CM

## 2024-04-08 DIAGNOSIS — D751 Secondary polycythemia: Secondary | ICD-10-CM | POA: Insufficient documentation

## 2024-04-08 DIAGNOSIS — J189 Pneumonia, unspecified organism: Secondary | ICD-10-CM | POA: Insufficient documentation

## 2024-04-08 DIAGNOSIS — E785 Hyperlipidemia, unspecified: Secondary | ICD-10-CM | POA: Diagnosis present

## 2024-04-08 DIAGNOSIS — I5022 Chronic systolic (congestive) heart failure: Secondary | ICD-10-CM | POA: Diagnosis present

## 2024-04-08 DIAGNOSIS — Z923 Personal history of irradiation: Secondary | ICD-10-CM

## 2024-04-08 DIAGNOSIS — Z6833 Body mass index (BMI) 33.0-33.9, adult: Secondary | ICD-10-CM

## 2024-04-08 DIAGNOSIS — B9719 Other enterovirus as the cause of diseases classified elsewhere: Secondary | ICD-10-CM | POA: Diagnosis present

## 2024-04-08 DIAGNOSIS — Z1152 Encounter for screening for COVID-19: Secondary | ICD-10-CM

## 2024-04-08 DIAGNOSIS — J441 Chronic obstructive pulmonary disease with (acute) exacerbation: Secondary | ICD-10-CM | POA: Diagnosis present

## 2024-04-08 DIAGNOSIS — D7589 Other specified diseases of blood and blood-forming organs: Secondary | ICD-10-CM | POA: Diagnosis present

## 2024-04-08 DIAGNOSIS — I420 Dilated cardiomyopathy: Secondary | ICD-10-CM | POA: Diagnosis present

## 2024-04-08 HISTORY — DX: Chronic obstructive pulmonary disease, unspecified: J44.9

## 2024-04-08 LAB — CBC
HCT: 48.8 % — ABNORMAL HIGH (ref 36.0–46.0)
Hemoglobin: 17 g/dL — ABNORMAL HIGH (ref 12.0–15.0)
MCH: 36.6 pg — ABNORMAL HIGH (ref 26.0–34.0)
MCHC: 34.8 g/dL (ref 30.0–36.0)
MCV: 104.9 fL — ABNORMAL HIGH (ref 80.0–100.0)
Platelets: 225 10*3/uL (ref 150–400)
RBC: 4.65 MIL/uL (ref 3.87–5.11)
RDW: 17.2 % — ABNORMAL HIGH (ref 11.5–15.5)
WBC: 5.7 10*3/uL (ref 4.0–10.5)
nRBC: 0 % (ref 0.0–0.2)

## 2024-04-08 LAB — I-STAT VENOUS BLOOD GAS, ED
Acid-Base Excess: 0 mmol/L (ref 0.0–2.0)
Bicarbonate: 24.9 mmol/L (ref 20.0–28.0)
Calcium, Ion: 0.89 mmol/L — CL (ref 1.15–1.40)
HCT: 53 % — ABNORMAL HIGH (ref 36.0–46.0)
Hemoglobin: 18 g/dL — ABNORMAL HIGH (ref 12.0–15.0)
O2 Saturation: 100 %
Potassium: >8.5 mmol/L (ref 3.5–5.1)
Sodium: 125 mmol/L — ABNORMAL LOW (ref 135–145)
TCO2: 26 mmol/L (ref 22–32)
pCO2, Ven: 38.8 mmHg — ABNORMAL LOW (ref 44–60)
pH, Ven: 7.415 (ref 7.25–7.43)
pO2, Ven: 173 mmHg — ABNORMAL HIGH (ref 32–45)

## 2024-04-08 LAB — RESP PANEL BY RT-PCR (RSV, FLU A&B, COVID)  RVPGX2
Influenza A by PCR: NEGATIVE
Influenza B by PCR: NEGATIVE
Resp Syncytial Virus by PCR: NEGATIVE
SARS Coronavirus 2 by RT PCR: NEGATIVE

## 2024-04-08 MED ORDER — IPRATROPIUM-ALBUTEROL 0.5-2.5 (3) MG/3ML IN SOLN
3.0000 mL | Freq: Once | RESPIRATORY_TRACT | Status: AC
  Administered 2024-04-08: 3 mL via RESPIRATORY_TRACT
  Filled 2024-04-08: qty 3

## 2024-04-08 NOTE — ED Provider Notes (Signed)
 [place] EMERGENCY DEPARTMENT AT [facility] Provider Note  CSN: [id number] Arrival date & time: 04/08/24 2138  Chief Complaint(s) Shortness of Breath  HPI Margaret Douglas is a 68 y.o. female history of hypertension, COPD presenting to the emergency department with shortness of breath.  Patient reports shortness of breath for the past week which has been worsening.  Associated productive cough, EMS reports sinus congestion but she reports more chest congestion.  Patient here with son, son reports that she has been having trouble breathing for some time now and recently quit smoking.  No chest pain, back pain.  No abdominal pain.  No leg swelling.  No fevers or chills.  Paramedics found patient to be 77% on room air.   Past Medical History Past Medical History:  Diagnosis Date   Breast cancer (HCC) 2007   RT LUMPECTOMY   Hypertension    Hypothyroidism    Personal history of chemotherapy    Personal history of radiation therapy    Pre-diabetes    Patient Active Problem List   Diagnosis Date Noted   Goals of care, counseling/discussion 02/26/2020   Ductal carcinoma in situ (DCIS) of left breast 02/26/2020   Acquired hypothyroidism 02/22/2020   Borderline diabetes mellitus 02/22/2020   COPD (chronic obstructive pulmonary disease) (HCC) 02/22/2020   History of breast cancer 02/22/2020   Non morbid obesity due to excess calories 02/22/2020   Pure hypercholesterolemia 02/22/2020   Tobacco dependence 02/22/2020   Essential hypertension 06/10/2018   Home Medication(s) Prior to Admission medications   Medication Sig Start Date End Date Taking? Authorizing Provider  amLODipine (NORVASC) 5 MG tablet Take 5 mg by mouth daily.  08/02/19 08/01/20  [provider]  Aspirin-Salicylamide-Caffeine (BC HEADACHE POWDER PO) Take 1-2 packets by mouth 2 (two) times daily as needed (pain).     [provider]  atorvastatin (LIPITOR) 40 MG tablet Take 40 mg by mouth daily.   10/20/19   [provider]  letrozole  (FEMARA ) 2.5 MG tablet Take 1 tablet (2.5 mg total) by mouth daily. 03/17/20   Rao, Archana C, MD  levothyroxine (SYNTHROID) 150 MCG tablet Take 150 mcg by mouth daily.  10/20/19   [provider]                                                                                                                                    Past Surgical History Past Surgical History:  Procedure Laterality Date   BREAST BIOPSY Right 2007   breast ca rad and chemo   BREAST BIOPSY Left 02/14/2020   Affirm bx-"x" clip-DUCTAL CARCINOMA IN SITU, NUCLEAR GRADE 2-3, WITH COMEDONECROSIS   BREAST LUMPECTOMY     PARTIAL MASTECTOMY WITH NEEDLE LOCALIZATION AND AXILLARY SENTINEL LYMPH NODE BX Left 03/06/2020   Procedure: PARTIAL MASTECTOMY WITH NEEDLE LOCALIZATION AND AXILLARY SENTINEL LYMPH NODE BX;  Surgeon: Eldred Grego, MD;  Location: [facility] ORS;  Service: General;  Laterality: Left;   Family History Family History  Problem Relation Age of Onset   COPD Mother    Healthy Father    Suicidality Brother 30   Breast cancer Neg Hx     Social History Social History   Tobacco Use   Smoking status: Every Day    Current packs/day: 0.50    Average packs/day: 0.5 packs/day for 35.0 years (17.5 ttl pk-yrs)    Types: Cigarettes   Smokeless tobacco: Never  Substance Use Topics   Alcohol use: Yes    Alcohol/week: 1.0 standard drink of alcohol    Types: 1 Standard drinks or equivalent per week    Comment: daily   Drug use: Not Currently   Allergies Patient has no known allergies.  Review of Systems Review of Systems  All other systems reviewed and are negative.   Physical Exam Vital Signs  I have reviewed the triage vital signs BP (!) 145/93   Pulse 87   Temp (!) 97.2 F (36.2 C) (Axillary)   Resp 17   Wt 104.3 kg   SpO2 100%   BMI 33.00 kg/m  Physical Exam Vitals and nursing note reviewed.  Constitutional:      General: She is in  acute distress.     Appearance: She is well-developed.  HENT:     Head: Normocephalic and atraumatic.     Mouth/Throat:     Mouth: Mucous membranes are moist.  Eyes:     Pupils: Pupils are equal, round, and reactive to light.  Cardiovascular:     Rate and Rhythm: Normal rate and regular rhythm.     Heart sounds: No murmur heard. Pulmonary:     Effort: Pulmonary effort is normal. Tachypnea present. No respiratory distress.     Breath sounds: Examination of the right-upper field reveals wheezing. Examination of the left-upper field reveals wheezing. Examination of the right-middle field reveals wheezing. Examination of the left-middle field reveals wheezing. Examination of the right-lower field reveals wheezing. Examination of the left-lower field reveals wheezing. Wheezing present.  Abdominal:     General: Abdomen is flat.     Palpations: Abdomen is soft.     Tenderness: There is no abdominal tenderness.  Musculoskeletal:        General: No tenderness.     Right lower leg: No edema.     Left lower leg: No edema.  Skin:    General: Skin is warm and dry.  Neurological:     General: No focal deficit present.     Mental Status: She is alert. Mental status is at baseline.  Psychiatric:        Mood and Affect: Mood normal.        Behavior: Behavior normal.     ED Results and Treatments Labs (all labs ordered are listed, but only abnormal results are displayed) Labs Reviewed  CBC - Abnormal; Notable for the following components:      Result Value   Hemoglobin 17.0 (*)    HCT 48.8 (*)    MCV 104.9 (*)    MCH 36.6 (*)    RDW 17.2 (*)    All other components within normal limits  I-STAT VENOUS BLOOD GAS, ED - Abnormal; Notable for the following components:   pCO2, Ven 38.8 (*)    pO2, Ven 173 (*)    Sodium 125 (*)    Potassium >8.5 (*)    Calcium, Ion 0.89 (*)    HCT 53.0 (*)    Hemoglobin 18.0 (*)  All other components within normal limits  RESP PANEL BY RT-PCR (RSV, FLU  A&B, COVID)  RVPGX2  BRAIN NATRIURETIC PEPTIDE  COMPREHENSIVE METABOLIC PANEL WITH GFR                                                                                                                          Radiology DG Chest Port 1 View Result Date: 04/08/2024 CLINICAL DATA:  Short of breath EXAM: PORTABLE CHEST 1 VIEW COMPARISON:  01/27/2006 FINDINGS: Single frontal view of the chest demonstrates an unremarkable cardiac silhouette. There is diffuse increased interstitial prominence of uncertain acuity. No airspace disease, effusion, or pneumothorax. No acute bony abnormalities. IMPRESSION: 1. Diffuse increased interstitial prominence, which could be related to chronic interstitial scarring, atypical infection such as viral pneumonitis, or developing interstitial edema. Electronically Signed   By: Bobbye Burrow M.D.   On: 04/08/2024 22:04    Pertinent labs & imaging results that were available during my care of the patient were reviewed by me and considered in my medical decision making (see MDM for details).  Medications Ordered in ED Medications  ipratropium-albuterol  (DUONEB) 0.5-2.5 (3) MG/3ML nebulizer solution 3 mL (3 mLs Nebulization Given 04/08/24 2307)                                                                                                                                     Procedures Procedures  (including critical care time)  Medical Decision Making / ED Course   MDM:  68 year old presenting to the emergency department with shortness of breath.  Patient initially 77% on room air, she received steroids, DuoNeb, magnesium with paramedics.  In ER patient 89% on room air but still requiring oxygen.  Does not use oxygen at home.  Chest x-ray with nonspecific interstitial changes, no focal pneumonia.  Labs with no leukocytosis.  Initial VBG without hypercapnia.  Does have hyperkalemia likely due to hemolysis.  Patient has no associated EKG changes.  Suspect likely COPD  exacerbation.  Patient without significant lower extremity swelling or other process.  Lower concern for other process such as pulmonary embolism, acute CHF.  Suspect patient will need admission for hypoxic respiratory failure.  Signed out to oncoming physician Dr. Monique Ano pending reassessment and remainder of labs.     Additional history obtained: -Additional history obtained from family and ems -External records from outside source obtained and reviewed including: Chart review  including previous notes, labs, imaging, consultation notes including prior notes    Lab Tests: -I ordered, reviewed, and interpreted labs.   The pertinent results include:   Labs Reviewed  CBC - Abnormal; Notable for the following components:      Result Value   Hemoglobin 17.0 (*)    HCT 48.8 (*)    MCV 104.9 (*)    MCH 36.6 (*)    RDW 17.2 (*)    All other components within normal limits  I-STAT VENOUS BLOOD GAS, ED - Abnormal; Notable for the following components:   pCO2, Ven 38.8 (*)    pO2, Ven 173 (*)    Sodium 125 (*)    Potassium >8.5 (*)    Calcium, Ion 0.89 (*)    HCT 53.0 (*)    Hemoglobin 18.0 (*)    All other components within normal limits  RESP PANEL BY RT-PCR (RSV, FLU A&B, COVID)  RVPGX2  BRAIN NATRIURETIC PEPTIDE  COMPREHENSIVE METABOLIC PANEL WITH GFR    Notable for mild elevated hgb. Hemolysis on vbg  EKG   EKG Interpretation Date/Time:  Thursday Apr 08 2024 21:40:02 EDT Ventricular Rate:  86 PR Interval:  56 QRS Duration:  158 QT Interval:  422 QTC Calculation: 505 R Axis:   250  Text Interpretation: Sinus rhythm Short PR interval Nonspecific IVCD with LAD Anterolateral infarct, old Confirmed by Hiawatha Lout ([id number]) on 04/08/2024 10:12:37 PM         Imaging Studies ordered: I ordered imaging studies including CXR On my interpretation imaging demonstrates ?interstitial abnormality  I independently visualized and interpreted imaging. I agree with the radiologist  interpretation   Medicines ordered and prescription drug management: Meds ordered this encounter  Medications   ipratropium-albuterol  (DUONEB) 0.5-2.5 (3) MG/3ML nebulizer solution 3 mL    -I have reviewed the patients home medicines and have made adjustments as needed   Cardiac Monitoring: The patient was maintained on a cardiac monitor.  I personally viewed and interpreted the cardiac monitored which showed an underlying rhythm of: NSR  Social Determinants of Health:  Diagnosis or treatment significantly limited by social determinants of health: former smoker   Reevaluation: After the interventions noted above, I reevaluated the patient and found that their symptoms have improved  Co morbidities that complicate the patient evaluation  Past Medical History:  Diagnosis Date   Breast cancer (HCC) 2007   RT LUMPECTOMY   Hypertension    Hypothyroidism    Personal history of chemotherapy    Personal history of radiation therapy    Pre-diabetes       Dispostion: Disposition decision including need for hospitalization was considered, and patient disposition pending at time of sign out.    Final Clinical Impression(s) / ED Diagnoses Final diagnoses:  Acute hypoxic respiratory failure (HCC)     This chart was dictated using voice recognition software.  Despite best efforts to proofread,  errors can occur which can change the documentation meaning.    Mordecai Applebaum, MD 04/08/24 2329

## 2024-04-08 NOTE — ED Provider Notes (Signed)
 Care assumed at 2330.  Patient with history of COPD here with increased shortness of breath over the last month, new oxygen requirement.  Care assumed pending repeat labs as initial labs were hemolyzed.  Patient with persistent oxygen requirement on recheck after nebs, has rhonchi bilaterally.  Current picture is not consistent with pneumonia.  Plan to admit for likely COPD exacerbation with ongoing evaluation and treatment.  Patient is in agreement with treatment plan.  Hospitalist consulted for admission for ongoing care.     Kelsey Patricia, MD 04/09/24 [phone]

## 2024-04-08 NOTE — ED Triage Notes (Signed)
 Patient BIB [facility] c/o sob from battling a sinus infection for a week.  Patient got 2 duonebs, 2 g mag, 125 solumedrol in route.  20 L hand

## 2024-04-09 ENCOUNTER — Inpatient Hospital Stay (HOSPITAL_COMMUNITY)

## 2024-04-09 ENCOUNTER — Encounter (HOSPITAL_COMMUNITY): Payer: Self-pay | Admitting: Internal Medicine

## 2024-04-09 DIAGNOSIS — J9601 Acute respiratory failure with hypoxia: Secondary | ICD-10-CM | POA: Diagnosis present

## 2024-04-09 DIAGNOSIS — N183 Chronic kidney disease, stage 3 unspecified: Secondary | ICD-10-CM | POA: Diagnosis present

## 2024-04-09 DIAGNOSIS — J441 Chronic obstructive pulmonary disease with (acute) exacerbation: Secondary | ICD-10-CM | POA: Diagnosis present

## 2024-04-09 DIAGNOSIS — Z1152 Encounter for screening for COVID-19: Secondary | ICD-10-CM | POA: Diagnosis not present

## 2024-04-09 DIAGNOSIS — I429 Cardiomyopathy, unspecified: Secondary | ICD-10-CM

## 2024-04-09 DIAGNOSIS — D539 Nutritional anemia, unspecified: Secondary | ICD-10-CM | POA: Insufficient documentation

## 2024-04-09 DIAGNOSIS — B9789 Other viral agents as the cause of diseases classified elsewhere: Secondary | ICD-10-CM | POA: Diagnosis present

## 2024-04-09 DIAGNOSIS — Z853 Personal history of malignant neoplasm of breast: Secondary | ICD-10-CM | POA: Diagnosis not present

## 2024-04-09 DIAGNOSIS — Z6833 Body mass index (BMI) 33.0-33.9, adult: Secondary | ICD-10-CM | POA: Diagnosis not present

## 2024-04-09 DIAGNOSIS — I428 Other cardiomyopathies: Secondary | ICD-10-CM | POA: Diagnosis not present

## 2024-04-09 DIAGNOSIS — B9719 Other enterovirus as the cause of diseases classified elsewhere: Secondary | ICD-10-CM | POA: Diagnosis present

## 2024-04-09 DIAGNOSIS — I459 Conduction disorder, unspecified: Secondary | ICD-10-CM | POA: Diagnosis present

## 2024-04-09 DIAGNOSIS — D751 Secondary polycythemia: Secondary | ICD-10-CM | POA: Insufficient documentation

## 2024-04-09 DIAGNOSIS — E785 Hyperlipidemia, unspecified: Secondary | ICD-10-CM | POA: Diagnosis present

## 2024-04-09 DIAGNOSIS — J206 Acute bronchitis due to rhinovirus: Secondary | ICD-10-CM | POA: Diagnosis not present

## 2024-04-09 DIAGNOSIS — I420 Dilated cardiomyopathy: Secondary | ICD-10-CM | POA: Diagnosis present

## 2024-04-09 DIAGNOSIS — E66811 Obesity, class 1: Secondary | ICD-10-CM | POA: Diagnosis present

## 2024-04-09 DIAGNOSIS — Z79890 Hormone replacement therapy (postmenopausal): Secondary | ICD-10-CM | POA: Diagnosis not present

## 2024-04-09 DIAGNOSIS — T381X6A Underdosing of thyroid hormones and substitutes, initial encounter: Secondary | ICD-10-CM | POA: Diagnosis present

## 2024-04-09 DIAGNOSIS — J189 Pneumonia, unspecified organism: Secondary | ICD-10-CM | POA: Insufficient documentation

## 2024-04-09 DIAGNOSIS — R0609 Other forms of dyspnea: Secondary | ICD-10-CM

## 2024-04-09 DIAGNOSIS — F17210 Nicotine dependence, cigarettes, uncomplicated: Secondary | ICD-10-CM | POA: Diagnosis present

## 2024-04-09 DIAGNOSIS — D7589 Other specified diseases of blood and blood-forming organs: Secondary | ICD-10-CM | POA: Diagnosis present

## 2024-04-09 DIAGNOSIS — I447 Left bundle-branch block, unspecified: Secondary | ICD-10-CM | POA: Diagnosis present

## 2024-04-09 DIAGNOSIS — Z91128 Patient's intentional underdosing of medication regimen for other reason: Secondary | ICD-10-CM | POA: Diagnosis not present

## 2024-04-09 DIAGNOSIS — I130 Hypertensive heart and chronic kidney disease with heart failure and stage 1 through stage 4 chronic kidney disease, or unspecified chronic kidney disease: Secondary | ICD-10-CM | POA: Diagnosis present

## 2024-04-09 DIAGNOSIS — J440 Chronic obstructive pulmonary disease with acute lower respiratory infection: Secondary | ICD-10-CM | POA: Diagnosis present

## 2024-04-09 DIAGNOSIS — J209 Acute bronchitis, unspecified: Secondary | ICD-10-CM | POA: Diagnosis present

## 2024-04-09 DIAGNOSIS — K760 Fatty (change of) liver, not elsewhere classified: Secondary | ICD-10-CM | POA: Diagnosis present

## 2024-04-09 DIAGNOSIS — J1289 Other viral pneumonia: Secondary | ICD-10-CM | POA: Diagnosis present

## 2024-04-09 DIAGNOSIS — E039 Hypothyroidism, unspecified: Secondary | ICD-10-CM | POA: Diagnosis present

## 2024-04-09 DIAGNOSIS — I5022 Chronic systolic (congestive) heart failure: Secondary | ICD-10-CM | POA: Diagnosis present

## 2024-04-09 LAB — PHOSPHORUS: Phosphorus: 3.8 mg/dL (ref 2.5–4.6)

## 2024-04-09 LAB — ECHOCARDIOGRAM COMPLETE
AR max vel: 1.93 cm2
AV Area VTI: 1.68 cm2
AV Area mean vel: 1.59 cm2
AV Mean grad: 6 mmHg
AV Peak grad: 10 mmHg
Ao pk vel: 1.58 m/s
Area-P 1/2: 3.65 cm2
Calc EF: 53.6 %
MV VTI: 1.95 cm2
S' Lateral: 3.7 cm
Single Plane A2C EF: 52.5 %
Single Plane A4C EF: 53.4 %
Weight: 3680 [oz_av]

## 2024-04-09 LAB — CBC WITH DIFFERENTIAL/PLATELET
Abs Immature Granulocytes: 0.06 10*3/uL (ref 0.00–0.07)
Basophils Absolute: 0 10*3/uL (ref 0.0–0.1)
Basophils Relative: 0 %
Eosinophils Absolute: 0 10*3/uL (ref 0.0–0.5)
Eosinophils Relative: 0 %
HCT: 45.5 % (ref 36.0–46.0)
Hemoglobin: 16 g/dL — ABNORMAL HIGH (ref 12.0–15.0)
Immature Granulocytes: 1 %
Lymphocytes Relative: 3 %
Lymphs Abs: 0.2 10*3/uL — ABNORMAL LOW (ref 0.7–4.0)
MCH: 36.9 pg — ABNORMAL HIGH (ref 26.0–34.0)
MCHC: 35.2 g/dL (ref 30.0–36.0)
MCV: 104.8 fL — ABNORMAL HIGH (ref 80.0–100.0)
Monocytes Absolute: 0.1 10*3/uL (ref 0.1–1.0)
Monocytes Relative: 1 %
Neutro Abs: 6 10*3/uL (ref 1.7–7.7)
Neutrophils Relative %: 95 %
Platelets: 208 10*3/uL (ref 150–400)
RBC: 4.34 MIL/uL (ref 3.87–5.11)
RDW: 16.8 % — ABNORMAL HIGH (ref 11.5–15.5)
WBC: 6.4 10*3/uL (ref 4.0–10.5)
nRBC: 0 % (ref 0.0–0.2)

## 2024-04-09 LAB — BASIC METABOLIC PANEL WITH GFR
Anion gap: 16 — ABNORMAL HIGH (ref 5–15)
BUN: 27 mg/dL — ABNORMAL HIGH (ref 8–23)
CO2: 26 mmol/L (ref 22–32)
Calcium: 9.6 mg/dL (ref 8.9–10.3)
Chloride: 95 mmol/L — ABNORMAL LOW (ref 98–111)
Creatinine, Ser: 0.99 mg/dL (ref 0.44–1.00)
GFR, Estimated: >60 mL/min (ref >60)
Glucose, Bld: 162 mg/dL — ABNORMAL HIGH (ref 70–99)
Potassium: 3.8 mmol/L (ref 3.5–5.1)
Sodium: 137 mmol/L (ref 135–145)

## 2024-04-09 LAB — COMPREHENSIVE METABOLIC PANEL WITH GFR
ALT: 41 U/L (ref 0–44)
ALT: 40 U/L (ref 0–44)
AST: 91 U/L — ABNORMAL HIGH (ref 15–41)
AST: 74 U/L — ABNORMAL HIGH (ref 15–41)
Albumin: 4 g/dL (ref 3.5–5.0)
Albumin: 3.9 g/dL (ref 3.5–5.0)
Alkaline Phosphatase: 67 U/L (ref 38–126)
Alkaline Phosphatase: 63 U/L (ref 38–126)
Anion gap: 17 — ABNORMAL HIGH (ref 5–15)
Anion gap: 16 — ABNORMAL HIGH (ref 5–15)
BUN: 27 mg/dL — ABNORMAL HIGH (ref 8–23)
BUN: 26 mg/dL — ABNORMAL HIGH (ref 8–23)
CO2: 22 mmol/L (ref 22–32)
CO2: 26 mmol/L (ref 22–32)
Calcium: 9.9 mg/dL (ref 8.9–10.3)
Calcium: 9.5 mg/dL (ref 8.9–10.3)
Chloride: 98 mmol/L (ref 98–111)
Chloride: 95 mmol/L — ABNORMAL LOW (ref 98–111)
Creatinine, Ser: 1.09 mg/dL — ABNORMAL HIGH (ref 0.44–1.00)
Creatinine, Ser: 1 mg/dL (ref 0.44–1.00)
GFR, Estimated: 55 mL/min — ABNORMAL LOW (ref >60)
GFR, Estimated: >60 mL/min (ref >60)
Glucose, Bld: 108 mg/dL — ABNORMAL HIGH (ref 70–99)
Glucose, Bld: 161 mg/dL — ABNORMAL HIGH (ref 70–99)
Potassium: 4.5 mmol/L (ref 3.5–5.1)
Potassium: 3.8 mmol/L (ref 3.5–5.1)
Sodium: 137 mmol/L (ref 135–145)
Sodium: 137 mmol/L (ref 135–145)
Total Bilirubin: 1.7 mg/dL — ABNORMAL HIGH (ref 0.0–1.2)
Total Bilirubin: 1.2 mg/dL (ref 0.0–1.2)
Total Protein: 6.5 g/dL (ref 6.5–8.1)
Total Protein: 6.1 g/dL — ABNORMAL LOW (ref 6.5–8.1)

## 2024-04-09 LAB — RESPIRATORY PANEL BY PCR

## 2024-04-09 LAB — HEPATIC FUNCTION PANEL
ALT: 40 U/L (ref 0–44)
AST: 74 U/L — ABNORMAL HIGH (ref 15–41)
Albumin: 3.9 g/dL (ref 3.5–5.0)
Alkaline Phosphatase: 65 U/L (ref 38–126)
Bilirubin, Direct: 0.3 mg/dL — ABNORMAL HIGH (ref 0.0–0.2)
Indirect Bilirubin: 0.9 mg/dL (ref 0.3–0.9)
Total Bilirubin: 1.2 mg/dL (ref 0.0–1.2)
Total Protein: 6.2 g/dL — ABNORMAL LOW (ref 6.5–8.1)

## 2024-04-09 LAB — BRAIN NATRIURETIC PEPTIDE
B Natriuretic Peptide: 48.4 pg/mL (ref 0.0–100.0)
B Natriuretic Peptide: 122.8 pg/mL — ABNORMAL HIGH (ref 0.0–100.0)

## 2024-04-09 LAB — TROPONIN I (HIGH SENSITIVITY)
Troponin I (High Sensitivity): 10 ng/L (ref <18)
Troponin I (High Sensitivity): 10 ng/L (ref <18)

## 2024-04-09 LAB — HIV ANTIBODY (ROUTINE TESTING W REFLEX): HIV Screen 4th Generation wRfx: NONREACTIVE

## 2024-04-09 LAB — MAGNESIUM: Magnesium: 2.1 mg/dL (ref 1.7–2.4)

## 2024-04-09 LAB — D-DIMER, QUANTITATIVE: D-Dimer, Quant: 0.64 ug{FEU}/mL — ABNORMAL HIGH (ref 0.00–0.50)

## 2024-04-09 LAB — TSH: TSH: 23.736 u[IU]/mL — ABNORMAL HIGH (ref 0.350–4.500)

## 2024-04-09 MED ORDER — IPRATROPIUM-ALBUTEROL 0.5-2.5 (3) MG/3ML IN SOLN
3.0000 mL | RESPIRATORY_TRACT | Status: DC | PRN
  Administered 2024-04-09: 3 mL via RESPIRATORY_TRACT

## 2024-04-09 MED ORDER — SODIUM CHLORIDE 0.9 % IV SOLN
2.0000 g | Freq: Every day | INTRAVENOUS | Status: AC
  Administered 2024-04-09 – 2024-04-13 (×5): 2 g via INTRAVENOUS
  Filled 2024-04-09 (×5): qty 20

## 2024-04-09 MED ORDER — ENOXAPARIN SODIUM 40 MG/0.4ML IJ SOSY
40.0000 mg | PREFILLED_SYRINGE | Freq: Every day | INTRAMUSCULAR | Status: DC
  Administered 2024-04-09 – 2024-04-15 (×7): 40 mg via SUBCUTANEOUS
  Filled 2024-04-09 (×7): qty 0.4

## 2024-04-09 MED ORDER — METOPROLOL SUCCINATE ER 25 MG PO TB24
12.5000 mg | ORAL_TABLET | Freq: Every day | ORAL | Status: DC
  Administered 2024-04-09 – 2024-04-15 (×7): 12.5 mg via ORAL
  Filled 2024-04-09 (×7): qty 1

## 2024-04-09 MED ORDER — METHYLPREDNISOLONE SODIUM SUCC 125 MG IJ SOLR
80.0000 mg | Freq: Every day | INTRAMUSCULAR | Status: DC
  Administered 2024-04-09 – 2024-04-11 (×3): 80 mg via INTRAVENOUS
  Filled 2024-04-09 (×3): qty 2

## 2024-04-09 MED ORDER — LOSARTAN POTASSIUM 50 MG PO TABS
25.0000 mg | ORAL_TABLET | Freq: Every day | ORAL | Status: DC
  Administered 2024-04-09 – 2024-04-11 (×3): 25 mg via ORAL
  Filled 2024-04-09 (×3): qty 1

## 2024-04-09 MED ORDER — SODIUM CHLORIDE 0.9 % IV SOLN
500.0000 mg | Freq: Every day | INTRAVENOUS | Status: DC
  Filled 2024-04-09: qty 5

## 2024-04-09 MED ORDER — MELATONIN 5 MG PO TABS
5.0000 mg | ORAL_TABLET | Freq: Every evening | ORAL | Status: DC | PRN
  Administered 2024-04-13: 5 mg via ORAL
  Filled 2024-04-09 (×2): qty 1

## 2024-04-09 MED ORDER — SODIUM CHLORIDE 0.9 % IV SOLN
100.0000 mg | Freq: Two times a day (BID) | INTRAVENOUS | Status: DC
  Administered 2024-04-09 – 2024-04-10 (×4): 100 mg via INTRAVENOUS
  Filled 2024-04-09 (×5): qty 100

## 2024-04-09 MED ORDER — IPRATROPIUM-ALBUTEROL 0.5-2.5 (3) MG/3ML IN SOLN
3.0000 mL | RESPIRATORY_TRACT | Status: DC
  Filled 2024-04-09: qty 3

## 2024-04-09 MED ORDER — SPIRONOLACTONE 12.5 MG HALF TABLET
12.5000 mg | ORAL_TABLET | Freq: Every day | ORAL | Status: DC
  Administered 2024-04-09 – 2024-04-15 (×7): 12.5 mg via ORAL
  Filled 2024-04-09 (×8): qty 1

## 2024-04-09 NOTE — Plan of Care (Signed)

## 2024-04-09 NOTE — H&P (Signed)
 History and Physical    Margaret Douglas UJW:[id number] DOB: [birth year] DOA: 04/08/2024  Patient coming from: Home.  Chief Complaint: Shortness of breath.  HPI: Margaret Douglas is a 68 y.o. female with history of prior breast cancer status postmastectomy and radiation, ongoing tobacco abuse presents to the ER because of worsening shortness of breath.  Patient states she has been having productive cough shortness of breath and wheezing for the last 1 month.  Had gone to the urgent care about a week ago when patient was prescribed doxycycline  for sinusitis.  Despite taking which patient is still having productive cough and shortness of breath which has been acutely worsening.  Denies any chest pain fever or chills.  Denies any recent travel or sick contacts.  Has not been to a physician for many years now.  ED Course: In the ER patient is hypoxic requiring 5 L oxygen with chest x-ray showing bilateral infiltrates.  Patient having persistent productive cough in the ER.  On exam patient is also having wheezing.  Labs show BNP of 48 hemoglobin of 17 macrocytosis.  Creatinine 1.09 with no old labs to compare.  Patient has been placed on antibiotics nebulizers steroids admitted for acute respiratory failure with hypoxia.  Review of Systems: As per HPI, rest all negative.   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 She has a 17.5 pack-year smoking history. She has never used smokeless tobacco. She reports current alcohol use of about 1.0 standard drink of alcohol per week. She reports that she does not currently use drugs.  Not on File  Family History  Problem Relation Age of Onset   COPD Mother    Healthy Father    Suicidality Brother 64   Breast cancer Neg Hx     Prior to Admission medications   Medication Sig Start Date End Date Taking? Authorizing Provider  Aspirin-Salicylamide-Caffeine (BC HEADACHE POWDER PO) Take 1-2 packets by mouth 2 (two) times daily as needed (pain).    Yes [provider]  doxycycline  (VIBRAMYCIN ) 100 MG capsule Take 100 mg by mouth 2 (two) times daily. 04/01/24  Yes [provider]  Multiple Vitamins-Minerals (MULTIVITAMIN WOMEN 50+ PO) Take 1 capsule by mouth daily.   Yes [provider]  naproxen sodium (ALEVE) 220 MG tablet Take 440 mg by mouth as needed (headache).   Yes [provider]    Physical Exam: Constitutional: Moderately built and nourished. Vitals:   04/09/24 0330 04/09/24 0345 04/09/24 0432 04/09/24 0444  BP: 122/68 120/71 134/79   Pulse: 71 71 76   Resp: 19 19 20    Temp:   98.8 F (37.1 C)   TempSrc:   Axillary  SpO2: 97% 93% 93% 96%  Weight:       Eyes: Anicteric no pallor. ENMT: No discharge from the ears eyes nose and mouth. Neck: No JVD appreciated no mass felt. Respiratory: Mild expiratory phase.  No crepitations. Cardiovascular: S1-S2 heard. Abdomen: Soft nontender bowel sound present. Musculoskeletal: No edema. Skin: No rash. Neurologic: Alert awake oriented to time place and person.  Moves all extremities. Psychiatric: Appears normal.  Normal affect.   Labs on Admission: I have personally reviewed following labs and imaging  studies  CBC: Recent Labs  Lab 04/08/24 2206 04/08/24 2253  WBC 5.7  --   HGB 17.0* 18.0*  HCT 48.8* 53.0*  MCV 104.9*  --   PLT 225  --    Basic Metabolic Panel: Recent Labs  Lab 04/08/24 2253 04/08/24 2255  NA 125* 137  K >8.5* 4.5  CL  --  98  CO2  --  22  GLUCOSE  --  108*  BUN  --  27*  CREATININE  --  1.09*  CALCIUM  --  9.9   GFR: CrCl cannot be calculated (Unknown ideal weight.). Liver Function Tests: Recent Labs  Lab 04/08/24 2255  AST 91*  ALT 41  ALKPHOS 67  BILITOT 1.7*  PROT 6.5  ALBUMIN 4.0   No results for input(s): "LIPASE", "AMYLASE" in the last 168 hours. No results for input(s): "AMMONIA" in the last 168 hours. Coagulation Profile: No results for input(s): "INR", "PROTIME" in the last 168 hours. Cardiac Enzymes: No results for input(s): "CKTOTAL", "CKMB", "CKMBINDEX", "TROPONINI" in the last 168 hours. BNP (last 3 results) No results for input(s): "PROBNP" in the last 8760 hours. HbA1C: No results for input(s): "HGBA1C" in the last 72 hours. CBG: No results for input(s): "GLUCAP" in the last 168 hours. Lipid Profile: No results for input(s): "CHOL", "HDL", "LDLCALC", "TRIG", "CHOLHDL", "LDLDIRECT" in the last 72 hours. Thyroid  Function Tests: No results for input(s): "TSH", "T4TOTAL", "FREET4", "T3FREE", "THYROIDAB" in the last 72 hours. Anemia Panel: No results for input(s): "VITAMINB12", "FOLATE", "FERRITIN", "TIBC", "IRON", "RETICCTPCT" in the last 72 hours. Urine analysis: No results found for: "COLORURINE", "APPEARANCEUR", "LABSPEC", "PHURINE", "GLUCOSEU", "HGBUR", "BILIRUBINUR", "KETONESUR", "PROTEINUR", "UROBILINOGEN", "NITRITE", "LEUKOCYTESUR" Sepsis Labs: @LABRCNTIP (procalcitonin:4,lacticidven:4) ) Recent Results (from the past 240 hours)  Resp panel by RT-PCR (RSV, Flu A&B, Covid) Anterior Nasal Swab     Status: None   Collection Time: 04/08/24  9:47 PM   Specimen: Anterior Nasal Swab  Result Value Ref Range Status    SARS Coronavirus 2 by RT PCR NEGATIVE NEGATIVE Final   Influenza A by PCR NEGATIVE NEGATIVE Final   Influenza B by PCR NEGATIVE NEGATIVE Final    Comment: (NOTE) The Xpert Xpress SARS-CoV-2/FLU/RSV plus assay is intended as an aid in the diagnosis of influenza from Nasopharyngeal swab specimens and should not be used as a sole basis for treatment. Nasal washings and aspirates are unacceptable for Xpert Xpress SARS-CoV-2/FLU/RSV testing.  Fact Sheet for Patients: BloggerCourse.com  Fact Sheet for Healthcare Providers: SeriousBroker.it  This test is not yet approved or cleared by the [place]  FDA and has been authorized for detection and/or diagnosis of SARS-CoV-2 by FDA under an Emergency Use Authorization (EUA). This EUA will remain in effect (meaning this test can be used) for the duration of the COVID-19 declaration under Section 564(b)(1) of the Act, 21 U.S.C. section 360bbb-3(b)(1), unless the authorization is terminated or revoked.     Resp Syncytial Virus by PCR NEGATIVE NEGATIVE Final    Comment: (NOTE)  Fact Sheet for Patients: BloggerCourse.com  Fact Sheet for Healthcare Providers: SeriousBroker.it  This test is not yet approved or cleared by the [place]  FDA and has been authorized for detection and/or diagnosis of SARS-CoV-2 by FDA under an Emergency Use Authorization (EUA). This EUA will remain in effect (meaning this test can be used) for the duration of the COVID-19 declaration under Section 564(b)(1) of the Act, 21 U.S.C. section 360bbb-3(b)(1), unless the authorization is terminated or revoked.  Performed at [facility], [address], [place], [place] [address]      Radiological Exams on Admission: DG Chest Port 1 View Result Date: 04/08/2024 CLINICAL DATA:  Short of breath EXAM: PORTABLE CHEST 1 VIEW COMPARISON:  01/27/2006 FINDINGS:  Single frontal view of the chest demonstrates an unremarkable cardiac silhouette. There is diffuse increased interstitial prominence of uncertain acuity. No airspace disease, effusion, or pneumothorax. No acute bony abnormalities. IMPRESSION: 1. Diffuse increased interstitial prominence, which could be related to chronic interstitial scarring, atypical infection such as viral pneumonitis, or developing interstitial edema. Electronically Signed   By: Bobbye Burrow M.D.   On: 04/08/2024 22:04    EKG: Independently reviewed.  Sinus rhythm with IVCD.  Assessment/Plan Principal Problem:   Acute respiratory failure with hypoxia (HCC) Active Problems:   History of breast cancer   Acute bronchitis   Polycythemia   CKD (chronic kidney disease) stage 3, GFR 30-59 ml/min (HCC)   Macrocytic anemia    Acute respiratory failure with hypoxia presently on 5 L oxygen with chest x-ray showing bilateral infiltrates we will keep patient on empiric antibiotics for pneumonia.  Check sputum cultures respiratory viral panel and also will check CT chest without contrast and D-dimer.  Since the EKG shows IVCD will check 2D echo. Chronic kidney disease stage III versus acute renal failure with no old labs to compare.  Check UA.  Follow metabolic panel. Polycythemia could be from tobacco use.  Will closely monitor. Macrocytosis check B12 and folate levels. Prior history of breast cancer status post lumpectomy and radiation.  Given the acute respiratory failure with hypoxia requiring 5 L oxygen with possible pneumonia will need close monitoring and more than 2 midnight stay.   DVT prophylaxis: Lovenox . Code Status: Full code. Family Communication: Discussed with patient. Disposition Plan: Monitored bed. Consults called: None. Admission status: Inpatient.

## 2024-04-09 NOTE — ED Notes (Signed)
 Adrianna RN on 5N notified of pt transport to unit

## 2024-04-09 NOTE — Consult Note (Signed)
 Cardiology Consultation   Patient ID: Margaret Douglas MRN: [id number]; DOB: 24-Apr-1956  Admit date: 04/08/2024 Date of Consult: 04/09/2024  PCP:  Monique Ano, MD   [facility] Providers Cardiologist:     Patient Profile:   Margaret Douglas is a 68 y.o. female with a hx of hypertension, hyperlipidemia, COPD, breast cancer with history of chemotherapy, hypothyroidism who is being seen 04/09/2024 for the evaluation of cardiomyopathy at the request of Eveline Hipps DO.  History of Present Illness:   Patient admitted earlier today with complaints of shortness of breath.  She was treated with antibiotics, bronchodilators and steroids for possible pneumonia.  Echocardiogram was ordered and showed cardiomyopathy with ejection fraction 35 to 40%. Patient states that for 2 months she has had worsening dyspnea on exertion.  She describes "congestion" and cough productive of white sputum.  She denies orthopnea, PND, pedal edema.  No chest pain or hemoptysis.  She has been admitted and cardiology now asked to evaluate.  Note patient consumes one half fifth of alcohol per week.  She also has a history of breast cancer treated with right mastectomy, radiation and chemotherapy.   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, WITH COMEDONECROSIS   BREAST LUMPECTOMY     PARTIAL MASTECTOMY WITH NEEDLE LOCALIZATION AND AXILLARY SENTINEL LYMPH NODE BX Left 03/06/2020   Procedure: PARTIAL MASTECTOMY WITH NEEDLE LOCALIZATION AND AXILLARY SENTINEL LYMPH NODE BX;   Surgeon: Eldred Grego, MD;  Location: [facility] ORS;  Service: General;  Laterality: Left;     Inpatient Medications: Scheduled Meds:  enoxaparin  (LOVENOX ) injection  40 mg Subcutaneous Daily   methylPREDNISolone  (SOLU-MEDROL ) injection  80 mg Intravenous Daily   Continuous Infusions:  cefTRIAXone  (ROCEPHIN )  IV 2 g (04/09/24 0600)   doxycycline  (VIBRAMYCIN ) IV 100 mg (04/09/24 0700)   PRN Meds: ipratropium-albuterol   Allergies:   Not on File  Social History:   Social History   Socioeconomic History   Marital status: Divorced    Spouse name: Not on file   Number of children: 1   Years of education: Not on file   Highest education level: Not on file  Occupational History   Not on file  Tobacco Use   Smoking status: Every Day    Current packs/day: 0.50    Average packs/day: 0.5 packs/day for 35.0 years (17.5 ttl pk-yrs)    Types: Cigarettes   Smokeless tobacco: Never  Substance and Sexual Activity   Alcohol use: Yes    Alcohol/week: 1.0 standard drink of alcohol    Types: 1 Standard drinks or equivalent per week    Comment: daily   Drug use: Not Currently   Sexual activity: Not on file  Other Topics Concern   Not on file  Social History Narrative   Not on file   Social Drivers of Health   Financial Resource Strain: Not on file  Food Insecurity: No Food Insecurity (04/09/2024)   Hunger Vital Sign    Worried About Running Out of Food  in the Last Year: Never true    Ran Out of Food in the Last Year: Never true  Transportation Needs: No Transportation Needs (04/09/2024)   PRAPARE - Administrator, Civil Service (Medical): No    Lack of Transportation (Non-Medical): No  Physical Activity: Not on file  Stress: Not on file  Social Connections: Socially Isolated (04/09/2024)   Social Connection and Isolation Panel [NHANES]    Frequency of Communication with Friends and Family: Once a week    Frequency of Social Gatherings with Friends and Family: Twice a  week    Attends Religious Services: Never    Database administrator or Organizations: No    Attends Banker Meetings: Never    Marital Status: Divorced  Catering manager Violence: Not At Risk (04/09/2024)   Humiliation, Afraid, Rape, and Kick questionnaire    Fear of Current or Ex-Partner: No    Emotionally Abused: No    Physically Abused: No    Sexually Abused: No    Family History:    Family History  Problem Relation Age of Onset   COPD Mother    Healthy Father    Suicidality Brother 60   Breast cancer Neg Hx      ROS:  Please see the history of present illness.  Cough productive of white sputum but denies fevers, chills or hemoptysis. All other ROS reviewed and negative.     Physical Exam/Data:   Vitals:   04/09/24 0432 04/09/24 0444 04/09/24 0832 04/09/24 1433  BP: 134/79  125/72 135/67  Pulse: 76  73 75  Resp: 20  15 16   Temp: 98.8 F (37.1 C)  97.6 F (36.4 C) 97.9 F (36.6 C)  TempSrc: Axillary  Oral Oral  SpO2: 93% 96% 95% 91%  Weight:        Intake/Output Summary (Last 24 hours) at 04/09/2024 1810 Last data filed at 04/09/2024 1700 Gross per 24 hour  Intake 480 ml  Output --  Net 480 ml      04/08/2024    9:39 PM 03/17/2020    8:50 AM 03/06/2020    9:21 AM  Last 3 Weights  Weight (lbs) 230 lb 211 lb 4.8 oz 210 lb 1.6 oz  Weight (kg) 104.327 kg 95.845 kg 95.3 kg     Body mass index is 33 kg/m.  General:  Well nourished, well developed, in no acute distress; chronically ill-appearing HEENT: normal Neck: no JVD Vascular: No carotid bruits; Distal pulses 2+ bilaterally Cardiac:  normal S1, S2; RRR; no murmur  Lungs: Basilar crackles and diminished breath sounds throughout Abd: soft, nontender, no hepatomegaly  Ext: no edema Musculoskeletal:  No deformities, BUE and BLE strength normal and equal Skin: warm and dry  Neuro:  CNs 2-12 intact, no focal abnormalities noted Psych:  Normal affect   EKG:  The EKG was personally reviewed and  demonstrates: Sinus rhythm with first-degree AV block and left bundle branch block. Telemetry:  Telemetry was personally reviewed and demonstrates: Sinus rhythm  Laboratory Data:  High Sensitivity Troponin:   Recent Labs  Lab 04/09/24 0859 04/09/24 1042  TROPONINIHS 10 10     Chemistry Recent Labs  Lab 04/08/24 2255 04/09/24 0859 04/09/24 0900  NA 137 137 137  K 4.5 3.8 3.8  CL 98 95* 95*  CO2 22 26 26   GLUCOSE 108* 162* 161*  BUN 27* 27* 26*  CREATININE 1.09* 0.99 1.00  CALCIUM 9.9 9.6 9.5  MG  --   --  2.1  GFRNONAA 55* >60 >60  ANIONGAP 17* 16* 16*    Recent Labs  Lab 04/08/24 2255 04/09/24 0859 04/09/24 0900  PROT 6.5 6.2* 6.1*  ALBUMIN 4.0 3.9 3.9  AST 91* 74* 74*  ALT 41 40 40  ALKPHOS 67 65 63  BILITOT 1.7* 1.2 1.2   Hematology Recent Labs  Lab 04/08/24 2206 04/08/24 2253 04/09/24 0859  WBC 5.7  --  6.4  RBC 4.65  --  4.34  HGB 17.0* 18.0* 16.0*  HCT 48.8* 53.0* 45.5  MCV 104.9*  --  104.8*  MCH 36.6*  --  36.9*  MCHC 34.8  --  35.2  RDW 17.2*  --  16.8*  PLT 225  --  208   Thyroid   Recent Labs  Lab 04/09/24 0900  TSH 23.736*    BNP Recent Labs  Lab 04/08/24 2206  BNP 48.4    DDimer  Recent Labs  Lab 04/09/24 0859  DDIMER 0.64*     Radiology/Studies:  ECHOCARDIOGRAM COMPLETE Result Date: 04/09/2024    ECHOCARDIOGRAM REPORT   Patient Name:   [facility] Date of Exam: 04/09/2024 Medical Rec #:  [id number]        Height:       70.0 in Accession #:    [id number]       Weight:       230.0 lb Date of Birth:  01-07-1956       BSA:          2.215 m Patient Age:    67 years         BP:           125/72 mmHg Patient Gender: F                HR:           79 bpm. Exam Location:  Inpatient Procedure: 2D Echo, Cardiac Doppler and Color Doppler (Both Spectral and Color            Flow Doppler were utilized during procedure). Indications:    Dyspnea  History:        Patient has no prior history of Echocardiogram examinations.  Sonographer:     Juanita Shaw Referring Phys: [id number] ARSHAD N KAKRAKANDY IMPRESSIONS  1. Left ventricular ejection fraction, by estimation, is 35 to 40%. The left ventricle has moderately decreased function. The left ventricle demonstrates global hypokinesis with septal-lateral dyssynchrony suggestive of IVCD. Left ventricular diastolic parameters are consistent with Grade I diastolic dysfunction (impaired relaxation).  2. Right ventricular systolic function is normal. The right ventricular size is normal. Tricuspid regurgitation signal is inadequate for assessing PA pressure.  3. The mitral valve is normal in structure. No evidence of mitral valve regurgitation. No evidence of mitral stenosis.  4. The aortic valve was not well visualized. Aortic valve regurgitation is not visualized. No aortic stenosis is present.  5. The inferior vena cava is normal in size with greater than 50% respiratory variability, suggesting right atrial pressure of 3 mmHg.  6. Technically difficult study with poor acoustic windows. FINDINGS  Left Ventricle: Left ventricular ejection fraction, by estimation, is 35 to 40%. The left ventricle has moderately decreased function. The left ventricle demonstrates global hypokinesis. The left ventricular internal cavity size was normal in size. There is no left ventricular hypertrophy. Left ventricular diastolic parameters are consistent with Grade I diastolic dysfunction (impaired relaxation). Right Ventricle: The right ventricular size is normal. No increase in right ventricular wall thickness. Right  ventricular systolic function is normal. Tricuspid regurgitation signal is inadequate for assessing PA pressure. Left Atrium: Left atrial size was normal in size. Right Atrium: Right atrial size was normal in size. Pericardium: Trivial pericardial effusion is present. Mitral Valve: The mitral valve is normal in structure. Mild to moderate mitral annular calcification. No evidence of mitral valve regurgitation. No  evidence of mitral valve stenosis. MV peak gradient, 4.9 mmHg. The mean mitral valve gradient is 3.0 mmHg. Tricuspid Valve: The tricuspid valve is normal in structure. Tricuspid valve regurgitation is not demonstrated. Aortic Valve: The aortic valve was not well visualized. Aortic valve regurgitation is not visualized. No aortic stenosis is present. Aortic valve mean gradient measures 6.0 mmHg. Aortic valve peak gradient measures 10.0 mmHg. Aortic valve area, by VTI measures 1.68 cm. Pulmonic Valve: The pulmonic valve was not well visualized. Pulmonic valve regurgitation is not visualized. Aorta: The aortic root is normal in size and structure. Venous: The inferior vena cava is normal in size with greater than 50% respiratory variability, suggesting right atrial pressure of 3 mmHg. IAS/Shunts: No atrial level shunt detected by color flow Doppler.  LEFT VENTRICLE PLAX 2D LVIDd:         5.50 cm      Diastology LVIDs:         3.70 cm      LV e' medial:    5.77 cm/s LV PW:         1.10 cm      LV E/e' medial:  10.5 LV IVS:        0.80 cm      LV e' lateral:   8.59 cm/s LVOT diam:     2.10 cm      LV E/e' lateral: 7.1 LV SV:         53 LV SV Index:   24 LVOT Area:     3.46 cm  LV Volumes (MOD) LV vol d, MOD A2C: 114.0 ml LV vol d, MOD A4C: 135.0 ml LV vol s, MOD A2C: 54.2 ml LV vol s, MOD A4C: 62.9 ml LV SV MOD A2C:     59.8 ml LV SV MOD A4C:     135.0 ml LV SV MOD BP:      67.3 ml RIGHT VENTRICLE             IVC RV Basal diam:  2.80 cm     IVC diam: 0.90 cm RV Mid diam:    2.10 cm RV S prime:     10.60 cm/s TAPSE (M-mode): 2.0 cm LEFT ATRIUM             Index        RIGHT ATRIUM          Index LA diam:        2.90 cm 1.31 cm/m   RA Area:     8.80 cm LA Vol (A2C):   41.6 ml 18.78 ml/m  RA Volume:   14.50 ml 6.55 ml/m LA Vol (A4C):   30.9 ml 13.95 ml/m LA Biplane Vol: 35.9 ml 16.21 ml/m  AORTIC VALVE                     PULMONIC VALVE AV Area (Vmax):    1.93 cm      PV Vmax:       0.74 m/s AV Area (Vmean):   1.59  cm      PV Peak grad:  2.2 mmHg AV Area (VTI):  1.68 cm AV Vmax:           158.00 cm/s AV Vmean:          119.000 cm/s AV VTI:            0.314 m AV Peak Grad:      10.0 mmHg AV Mean Grad:      6.0 mmHg LVOT Vmax:         87.90 cm/s LVOT Vmean:        54.500 cm/s LVOT VTI:          0.152 m LVOT/AV VTI ratio: 0.48  AORTA Ao Root diam: 3.00 cm Ao Asc diam:  3.10 cm MITRAL VALVE MV Area (PHT): 3.65 cm    SHUNTS MV Area VTI:   1.95 cm    Systemic VTI:  0.15 m MV Peak grad:  4.9 mmHg    Systemic Diam: 2.10 cm MV Mean grad:  3.0 mmHg MV Vmax:       1.11 m/s MV Vmean:      80.8 cm/s MV Decel Time: 208 msec MV E velocity: 60.60 cm/s MV A velocity: 95.20 cm/s MV E/A ratio:  0.64 Dalton McleanMD Electronically signed by Archer Bear Signature Date/Time: 04/09/2024/10:11:13 AM    Final    CT CHEST WO CONTRAST Result Date: 04/09/2024 CLINICAL DATA:  Pneumonia. EXAM: CT CHEST WITHOUT CONTRAST TECHNIQUE: Multidetector CT imaging of the chest was performed following the standard protocol without IV contrast. RADIATION DOSE REDUCTION: This exam was performed according to the departmental dose-optimization program which includes automated exposure control, adjustment of the mA and/or kV according to patient size and/or use of iterative reconstruction technique. COMPARISON:  01/29/2016 FINDINGS: Cardiovascular: The heart size is normal. No substantial pericardial effusion. Coronary artery calcification is evident. Mild atherosclerotic calcification is noted in the wall of the thoracic aorta. Mediastinum/Nodes: No mediastinal lymphadenopathy. No evidence for gross hilar lymphadenopathy although assessment is limited by the lack of intravenous contrast on the current study. The esophagus has normal imaging features. There is no axillary lymphadenopathy. Lungs/Pleura: No suspicious pulmonary nodule or mass. Dependent collapse/consolidation noted in both lower lobes, right greater than left. No pleural effusion. Upper Abdomen:  The liver shows diffusely decreased attenuation suggesting fat deposition. Musculoskeletal: No worrisome lytic or sclerotic osseous abnormality. IMPRESSION: 1. Dependent collapse/consolidation in both lower lobes, right greater than left. Imaging features likely reflect atelectasis although a degree of superimposed pneumonia is not excluded. 2. Hepatic steatosis. 3.  Aortic Atherosclerosis (ICD10-I70.0). Electronically Signed   By: Donnal Fusi M.D.   On: 04/09/2024 07:37   DG Chest Port 1 View Result Date: 04/08/2024 CLINICAL DATA:  Short of breath EXAM: PORTABLE CHEST 1 VIEW COMPARISON:  01/27/2006 FINDINGS: Single frontal view of the chest demonstrates an unremarkable cardiac silhouette. There is diffuse increased interstitial prominence of uncertain acuity. No airspace disease, effusion, or pneumothorax. No acute bony abnormalities. IMPRESSION: 1. Diffuse increased interstitial prominence, which could be related to chronic interstitial scarring, atypical infection such as viral pneumonitis, or developing interstitial edema. Electronically Signed   By: Bobbye Burrow M.D.   On: 04/08/2024 22:04     Assessment and Plan:   Cardiomyopathy-she apparently has had a cardiomyopathy in the past but I do not have full echocardiogram results available.  Based on an oncology note she was not given Herceptin due to a decrease in her ejection fraction post anthracycline from 60% to 45%. TSH is 23.7 but unclear if hypothyroid is contributing.  She also consumes one half  fifth of alcohol weekly.  Will treat medically for now.  Add Toprol  12.5 mg daily, losartan  25 mg daily (could transition to Entresto 24/26 twice daily if blood pressure allows) and spironolactone  12.5 mg daily.  She is not volume overloaded on examination/BNP 48 and therefore will not diurese.  Will plan outpatient CTA when she improves from her COPD flare to rule out coronary disease. Hypothyroidism-patient's TSH is significantly elevated.  Would  treat with Synthroid.  Management per primary service.  Note patient quit taking her thyroid  medication in the past.  I explained the importance of compliance. COPD-significant COPD on exam.  She does not appear to be in significant CHF at time of my evaluation and BNP is 48.  Continue therapy with bronchodilators, steroids and antibiotics. Macrocytosis-would check B12 and folate particular in light of alcohol use. Tobacco abuse-patient counseled on discontinuing.     For questions or updates, please contact [facility] Please consult [email or web address] for contact info under    Signed, Alexandria Angel, MD  04/09/2024 6:10 PM

## 2024-04-09 NOTE — Progress Notes (Signed)
  Echocardiogram 2D Echocardiogram has been performed.  Fleur Audino L Madellyn Denio RDCS 04/09/2024, 10:05 AM

## 2024-04-09 NOTE — Progress Notes (Signed)
 Care started prior to midnight in the emergency room and patient was admitted early this morning after midnight by Dr. Neda Balk and I am in current agreement with this assessment and plan.  Additional changes of plan of care been required.  The patient is a 68 year old obese female with a past medical history significant for millimeters to breast cancer status postmastectomy and radiation, ongoing tobacco abuse, hypertension, hyperlipidemia hypothyroidism, prediabetes and other comorbidities who presented to the ED because of worsening shortness of breath.  She has been having productive cough, shortness breath and wheezing for last 1 month had gone to urgent care about a week ago and was prescribed doxycycline  for sinusitis.  Despite taking this she still had a productive cough and shortness of breath which continues to worsen.  She had to be placed on 5 L supplemental oxygen via nasal cannula and chest x-ray showed bilateral infiltrates.  Bilateral shoulder macrocytosis and creatinine was 1.09.  She did be placed on antibiotics, nebulizers, steroids and was admitted for acute respiratory failure with hypoxia in the setting of bilateral no pneumonia.  She has been placed on antibiotics.  Further workup is being done and a CT of the chest has been ordered without contrast as well as a D-dimer and an echocardiogram.  D-dimer was mildly elevated at 0.64 and CT scan of the chest done and showed Dependent collapse/consolidation in both lower lobes, right greater than left. Imaging features likely reflect atelectasis although a degree of superimposed pneumonia is not excluded."  Echocardiogram was done and showed LVEF of 35 to 40%. The  left ventricle has moderately decreased function. The left ventricle  demonstrates global hypokinesis with septal-lateral dyssynchrony  suggestive of IVCD. Left ventricular diastolic  parameters are consistent with Grade I diastolic dysfunction (impaired relaxation). Right  ventricular systolic function is normal.  Given her echo findings we will consult Cardiology. BNP was 48.4.   Will continue breathing treatments, antibiotics and steroids.  Will hold diuresis currently but have cardiology evaluate to see if she would benefit from this.  Will have them evaluate if she would need further ischemic workup while in the hospital.  Will continue monitor her clinical response to intervention and repeat chest x-ray and blood work in a.m.

## 2024-04-10 ENCOUNTER — Inpatient Hospital Stay (HOSPITAL_COMMUNITY)

## 2024-04-10 DIAGNOSIS — J206 Acute bronchitis due to rhinovirus: Secondary | ICD-10-CM

## 2024-04-10 DIAGNOSIS — J189 Pneumonia, unspecified organism: Secondary | ICD-10-CM

## 2024-04-10 DIAGNOSIS — J9601 Acute respiratory failure with hypoxia: Secondary | ICD-10-CM | POA: Diagnosis not present

## 2024-04-10 DIAGNOSIS — I420 Dilated cardiomyopathy: Secondary | ICD-10-CM

## 2024-04-10 DIAGNOSIS — D539 Nutritional anemia, unspecified: Secondary | ICD-10-CM | POA: Diagnosis not present

## 2024-04-10 DIAGNOSIS — N183 Chronic kidney disease, stage 3 unspecified: Secondary | ICD-10-CM

## 2024-04-10 DIAGNOSIS — D751 Secondary polycythemia: Secondary | ICD-10-CM

## 2024-04-10 LAB — CBC WITH DIFFERENTIAL/PLATELET
Abs Immature Granulocytes: 0.04 10*3/uL (ref 0.00–0.07)
Basophils Absolute: 0 10*3/uL (ref 0.0–0.1)
Basophils Relative: 0 %
Eosinophils Absolute: 0 10*3/uL (ref 0.0–0.5)
Eosinophils Relative: 0 %
HCT: 48.3 % — ABNORMAL HIGH (ref 36.0–46.0)
Hemoglobin: 16.6 g/dL — ABNORMAL HIGH (ref 12.0–15.0)
Immature Granulocytes: 1 %
Lymphocytes Relative: 10 %
Lymphs Abs: 0.8 10*3/uL (ref 0.7–4.0)
MCH: 36.9 pg — ABNORMAL HIGH (ref 26.0–34.0)
MCHC: 34.4 g/dL (ref 30.0–36.0)
MCV: 107.3 fL — ABNORMAL HIGH (ref 80.0–100.0)
Monocytes Absolute: 0.5 10*3/uL (ref 0.1–1.0)
Monocytes Relative: 6 %
Neutro Abs: 6.6 10*3/uL (ref 1.7–7.7)
Neutrophils Relative %: 83 %
Platelets: 175 10*3/uL (ref 150–400)
RBC: 4.5 MIL/uL (ref 3.87–5.11)
RDW: 17.1 % — ABNORMAL HIGH (ref 11.5–15.5)
WBC: 8 10*3/uL (ref 4.0–10.5)
nRBC: 0 % (ref 0.0–0.2)

## 2024-04-10 LAB — COMPREHENSIVE METABOLIC PANEL WITH GFR
ALT: 48 U/L — ABNORMAL HIGH (ref 0–44)
AST: 105 U/L — ABNORMAL HIGH (ref 15–41)
Albumin: 3.7 g/dL (ref 3.5–5.0)
Alkaline Phosphatase: 66 U/L (ref 38–126)
Anion gap: 11 (ref 5–15)
BUN: 27 mg/dL — ABNORMAL HIGH (ref 8–23)
CO2: 29 mmol/L (ref 22–32)
Calcium: 9.4 mg/dL (ref 8.9–10.3)
Chloride: 96 mmol/L — ABNORMAL LOW (ref 98–111)
Creatinine, Ser: 0.89 mg/dL (ref 0.44–1.00)
GFR, Estimated: >60 mL/min (ref >60)
Glucose, Bld: 142 mg/dL — ABNORMAL HIGH (ref 70–99)
Potassium: 4.2 mmol/L (ref 3.5–5.1)
Sodium: 136 mmol/L (ref 135–145)
Total Bilirubin: 1 mg/dL (ref 0.0–1.2)
Total Protein: 6 g/dL — ABNORMAL LOW (ref 6.5–8.1)

## 2024-04-10 LAB — BASIC METABOLIC PANEL WITH GFR
Anion gap: 11 (ref 5–15)
BUN: 30 mg/dL — ABNORMAL HIGH (ref 8–23)
CO2: 26 mmol/L (ref 22–32)
Calcium: 9.3 mg/dL (ref 8.9–10.3)
Chloride: 97 mmol/L — ABNORMAL LOW (ref 98–111)
Creatinine, Ser: 1 mg/dL (ref 0.44–1.00)
GFR, Estimated: >60 mL/min (ref >60)
Glucose, Bld: 97 mg/dL (ref 70–99)
Potassium: 4.4 mmol/L (ref 3.5–5.1)
Sodium: 134 mmol/L — ABNORMAL LOW (ref 135–145)

## 2024-04-10 LAB — MAGNESIUM: Magnesium: 2.1 mg/dL (ref 1.7–2.4)

## 2024-04-10 LAB — T4, FREE: Free T4: <0.25 ng/dL — ABNORMAL LOW (ref 0.61–1.12)

## 2024-04-10 LAB — PHOSPHORUS: Phosphorus: 2.5 mg/dL (ref 2.5–4.6)

## 2024-04-10 MED ORDER — IPRATROPIUM BROMIDE 0.02 % IN SOLN
0.5000 mg | Freq: Three times a day (TID) | RESPIRATORY_TRACT | Status: DC
  Administered 2024-04-10 (×2): 0.5 mg via RESPIRATORY_TRACT
  Filled 2024-04-10 (×2): qty 2.5

## 2024-04-10 MED ORDER — LEVOTHYROXINE SODIUM 100 MCG PO TABS
100.0000 ug | ORAL_TABLET | Freq: Every day | ORAL | Status: DC
  Administered 2024-04-10 – 2024-04-15 (×6): 100 ug via ORAL
  Filled 2024-04-10 (×6): qty 1

## 2024-04-10 MED ORDER — ARFORMOTEROL TARTRATE 15 MCG/2ML IN NEBU
15.0000 ug | INHALATION_SOLUTION | Freq: Two times a day (BID) | RESPIRATORY_TRACT | Status: DC
  Administered 2024-04-10 – 2024-04-13 (×5): 15 ug via RESPIRATORY_TRACT
  Filled 2024-04-10 (×8): qty 2

## 2024-04-10 MED ORDER — LEVALBUTEROL HCL 0.63 MG/3ML IN NEBU
0.6300 mg | INHALATION_SOLUTION | Freq: Three times a day (TID) | RESPIRATORY_TRACT | Status: DC
  Administered 2024-04-10 (×2): 0.63 mg via RESPIRATORY_TRACT
  Filled 2024-04-10 (×2): qty 3

## 2024-04-10 MED ORDER — BUDESONIDE 0.25 MG/2ML IN SUSP
0.2500 mg | Freq: Two times a day (BID) | RESPIRATORY_TRACT | Status: DC
  Administered 2024-04-10 – 2024-04-13 (×5): 0.25 mg via RESPIRATORY_TRACT
  Filled 2024-04-10 (×8): qty 2

## 2024-04-10 MED ORDER — GUAIFENESIN ER 600 MG PO TB12
1200.0000 mg | ORAL_TABLET | Freq: Two times a day (BID) | ORAL | Status: DC
  Administered 2024-04-10 – 2024-04-15 (×11): 1200 mg via ORAL
  Filled 2024-04-10 (×11): qty 2

## 2024-04-10 NOTE — Progress Notes (Signed)
 PROGRESS NOTE    Margaret Douglas  ZOX:[id number] DOB: 1956/07/25 DOA: 04/08/2024 PCP: Monique Ano, MD   Brief Narrative:  Patient is a 68 year old  68 y.o. female with history of prior breast cancer status postmastectomy and radiation, ongoing tobacco abuse presents to the ER because of worsening shortness of breath.  Patient states she has been having productive cough shortness of breath and wheezing for the last 1 month.  Had gone to the urgent care about a week ago when patient was prescribed doxycycline  for sinusitis.  Despite taking which patient is still having productive cough and shortness of breath which has been acutely worsening.  Denies any chest pain fever or chills.  Denies any recent travel or sick contacts.  Has not been to a physician for many years now.  Further workup revealed a Rhino Enterovirus CAP and COPD exacerbation and ECHO done and showed a DCM so Cardiology consulted.  Assessment and Plan:  Acute Respiratory Failure with Hypoxia in the setting of community-acquired Pneumonia and Acute Exacerbation of COPD from Rhino/Enterovirus: Presently on 5 L oxygen with chest x-ray showing bilateral infiltrates we will keep patient on empiric antibiotics for pneumonia. D-dimer mildly elevated at 0.64. Check sputum cultures. Respiratory viral panel + for Rhino/Enterovirus. CT Chest done and showed " Dependent collapse/consolidation in both lower lobes, right greater than left. Imaging features likely reflect atelectasis although a degree of superimposed pneumonia is not excluded.Hepatic steatosis. Aortic Atherosclerosis" Flutter Valve, Incentive Spirometry, Guaifenesin 1200 mg po BID. Xopenex/Atrovent q8h. Brovana and Budesonide. C/w Abx with Doxy and Ceftriaxone . C/w Steroids. Will need Ambulatory Home O2 Screen prior to D/C. Check CXR in the AM. Repeat CXR today showed mild bibasilar ATX.   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  Could be related to her prior chemotherapy or alcohol abuse cardiology initiated Metoprolol  Succinate 12.5 mg po daily, Losartan  25 mg po Daily and Spironolactone  12.5 mg po daily.  They are recommending initiating Entresto 24-26 twice daily if blood pressure allows.  They are recommending outpatient CTA when she is improved from her respiratory illness. ECHO done and also had IVCD  Renal Insuffiencey: BUN/Cr Trend: Recent Labs  Lab 04/08/24 2255 04/09/24 0859 04/09/24 0900 04/10/24 0639  BUN 27* 27* 26* 30*  CREATININE 1.09* 0.99 1.00 1.00  -Avoid Nephrotoxic Medications, Contrast Dyes, Hypotension and Dehydration to Ensure Adequate Renal Perfusion and will need to Renally Adjust Meds. CTM and Trend Renal Function carefully and repeat CMP in the AM   Prior history of Breast Cancer: S/p lumpectomy and radiation.  Hypothyroidism: TSH 23.736 and Free T4 was <0.25. Start Levothyroxine at 100 mcg po Daily   Erthrocytosis and Macrocytosis: Hgb/Hct went from 18.0/53.0 -> 16.0/45.5 -> 16.6/48.3; MCV went from 104.9 -> 104.8 -> 107.3. Erythrocytosis likely 2/2 to Tobacco Abuse. Folate Level and B12 being checked given that she drinks EtOH.  Class I Obesity: Complicates overall prognosis and care. Estimated body mass index is 33 kg/m as calculated from the following:   Height as of 03/06/20: 5\' 10"  (1.778 m).   Weight as of this encounter: 104.3 kg. Weight Loss and Dietary Counseling given   DVT prophylaxis: enoxaparin  (LOVENOX ) injection 40 mg Start: 04/09/24 1000    Code Status: Full Code Family Communication: No family present @ bedside  Disposition Plan:  Level of care: Telemetry Medical Status is: Inpatient Remains inpatient appropriate because: Needs further clinical Improvement   Consultants:  Cardiology  Procedures:  ECHOCardiogram  Antimicrobials:  Anti-infectives (From admission, onward)    Start     Dose/Rate Route Frequency Ordered Stop   04/09/24  0645  doxycycline  (VIBRAMYCIN ) 100 mg in sodium chloride  0.9 % 250 mL IVPB        100 mg 125 mL/hr over 120 Minutes Intravenous 2 times daily 04/09/24 0605     04/09/24 0600  cefTRIAXone  (ROCEPHIN ) 2 g in sodium chloride  0.9 % 100 mL IVPB        2 g 200 mL/hr over 30 Minutes Intravenous Daily 04/09/24 0517 04/14/24 0959   04/09/24 0600  azithromycin  (ZITHROMAX ) 500 mg in sodium chloride  0.9 % 250 mL IVPB  Status:  Discontinued        500 mg 250 mL/hr over 60 Minutes Intravenous Daily 04/09/24 0517 04/09/24 0605       Subjective: Seen and examined at bedside, still feels short of breath.  States that she is feeling better than she came in but did not feel much better than yesterday.  Known lightheadedness or dizziness.  States that she is coughing but not bringing up any sputum.  Has a slightly sore abdomen.  No other concerns or complaints at this time.  Objective: Vitals:   04/10/24 0512 04/10/24 0958 04/10/24 1300 04/10/24 1707  BP: 119/72 115/79  106/62  Pulse:  60  61  Resp: 18 17  17   Temp: (!) 97.5 F (36.4 C) (!) 97.5 F (36.4 C)  97.8 F (36.6 C)  TempSrc: Oral Axillary  Oral  SpO2: 93% 94% 94% 94%  Weight:        Intake/Output Summary (Last 24 hours) at 04/10/2024 1946 Last data filed at 04/10/2024 1708 Gross per 24 hour  Intake 947.62 ml  Output 300 ml  Net 647.62 ml   Filed Weights   04/08/24 2139  Weight: 104.3 kg   Examination: Physical Exam:  Constitutional: WN/WD obese Caucasian female who appears a little uncomfortable and little short of breath Respiratory: Diminished to auscultation bilaterally with some coarse breath sounds and does have some rhonchi and some wheezing.  Noticeable Rales.  Wearing supplemental oxygen via nasal cannula Cardiovascular: RRR, no murmurs / rubs / gallops. S1 and S2 auscultated.  Mild extremity edema.  Abdomen: Soft, non-tender, distended secondary to body habitus bowel sounds positive.  GU: Deferred. Musculoskeletal: No  clubbing / cyanosis of digits/nails. No joint deformity upper and lower extremities. Skin: No rashes, lesions, ulcers on the Limited skin evaluation. No induration; Warm and dry.  Neurologic: CN 2-12 grossly intact with no focal deficits. Romberg sign and cerebellar reflexes not assessed.  Psychiatric: Normal judgment and insight. Alert and oriented x 3. Normal mood and appropriate affect.   Data Reviewed: I have personally reviewed following labs and imaging studies  CBC: Recent Labs  Lab 04/08/24 2206 04/08/24 2253 04/09/24 0859 04/10/24 1101  WBC 5.7  --  6.4 8.0  NEUTROABS  --   --  6.0 6.6  HGB 17.0* 18.0* 16.0* 16.6*  HCT 48.8* 53.0* 45.5 48.3*  MCV 104.9*  --  104.8* 107.3*  PLT 225  --  208 175   Basic Metabolic Panel: Recent Labs  Lab 04/08/24 2253 04/08/24 2255 04/09/24 0859 04/09/24 0900 04/10/24 0639 04/10/24 1537  NA 125* 137 137 137 134*  --   K >8.5* 4.5 3.8 3.8 4.4  --   CL  --  98 95* 95* 97*  --   CO2  --  22 26 26 26   --   GLUCOSE  --  108* 162* 161* 97  --   BUN  --  27* 27* 26* 30*  --   CREATININE  --  1.09* 0.99 1.00 1.00  --   CALCIUM  --  9.9 9.6 9.5 9.3  --   MG  --   --   --  2.1  --   --   PHOS  --   --   --  3.8  --  2.5   GFR: CrCl cannot be calculated (Unknown ideal weight.). Liver Function Tests: Recent Labs  Lab 04/08/24 2255 04/09/24 0859 04/09/24 0900  AST 91* 74* 74*  ALT 41 40 40  ALKPHOS 67 65 63  BILITOT 1.7* 1.2 1.2  PROT 6.5 6.2* 6.1*  ALBUMIN 4.0 3.9 3.9   No results for input(s): "LIPASE", "AMYLASE" in the last 168 hours. No results for input(s): "AMMONIA" in the last 168 hours. Coagulation Profile: No results for input(s): "INR", "PROTIME" in the last 168 hours. Cardiac Enzymes: No results for input(s): "CKTOTAL", "CKMB", "CKMBINDEX", "TROPONINI" in the last 168 hours. BNP (last 3 results) No results for input(s): "PROBNP" in the last 8760 hours. HbA1C: No results for input(s): "HGBA1C" in the last 72  hours. CBG: No results for input(s): "GLUCAP" in the last 168 hours. Lipid Profile: No results for input(s): "CHOL", "HDL", "LDLCALC", "TRIG", "CHOLHDL", "LDLDIRECT" in the last 72 hours. Thyroid  Function Tests: Recent Labs    04/09/24 0900 04/10/24 1537  TSH 23.736*  --   FREET4  --  <0.25*   Anemia Panel: No results for input(s): "VITAMINB12", "FOLATE", "FERRITIN", "TIBC", "IRON", "RETICCTPCT" in the last 72 hours. Sepsis Labs: No results for input(s): "PROCALCITON", "LATICACIDVEN" in the last 168 hours.  Recent Results (from the past 240 hours)  Resp panel by RT-PCR (RSV, Flu A&B, Covid) Anterior Nasal Swab     Status: None   Collection Time: 04/08/24  9:47 PM   Specimen: Anterior Nasal Swab  Result Value Ref Range Status   SARS Coronavirus 2 by RT PCR NEGATIVE NEGATIVE Final   Influenza A by PCR NEGATIVE NEGATIVE Final   Influenza B by PCR NEGATIVE NEGATIVE Final    Comment: (NOTE) The Xpert Xpress SARS-CoV-2/FLU/RSV plus assay is intended as an aid in the diagnosis of influenza from Nasopharyngeal swab specimens and should not be used as a sole basis for treatment. Nasal washings and aspirates are unacceptable for Xpert Xpress SARS-CoV-2/FLU/RSV testing.  Fact Sheet for Patients: BloggerCourse.com  Fact Sheet for Healthcare Providers: SeriousBroker.it  This test is not yet approved or cleared by the [place]  FDA and has been authorized for detection and/or diagnosis of SARS-CoV-2 by FDA under an Emergency Use Authorization (EUA). This EUA will remain in effect (meaning this test can be used) for the duration of the COVID-19 declaration under Section 564(b)(1) of the Act, 21 U.S.C. section 360bbb-3(b)(1), unless the authorization is terminated or revoked.     Resp Syncytial Virus by PCR NEGATIVE NEGATIVE Final    Comment: (NOTE) Fact Sheet for Patients: BloggerCourse.com  Fact Sheet  for Healthcare Providers: SeriousBroker.it  This test is not yet approved or cleared by the [place]  FDA and has been authorized for detection and/or diagnosis of SARS-CoV-2 by FDA under an Emergency Use Authorization (EUA). This EUA will remain in effect (meaning this test can be used) for the duration of the COVID-19 declaration under Section 564(b)(1) of the Act, 21 U.S.C. section 360bbb-3(b)(1), unless the authorization is terminated or revoked.  Performed at [facility], [address] [place], [place] [address]   Respiratory (~20 pathogens) panel by PCR     Status: Abnormal   Collection Time: 04/08/24  9:47 PM   Specimen: Nasopharyngeal Swab; Respiratory  Result Value Ref Range Status   Adenovirus NOT DETECTED NOT DETECTED Final   Coronavirus 229E NOT DETECTED NOT DETECTED Final    Comment: (NOTE) The Coronavirus on the Respiratory Panel, DOES NOT test for the novel  Coronavirus (2019 nCoV)    Coronavirus HKU1 NOT DETECTED NOT DETECTED Final   Coronavirus NL63 NOT DETECTED NOT DETECTED Final   Coronavirus OC43 NOT DETECTED NOT DETECTED Final   Metapneumovirus NOT DETECTED NOT DETECTED Final   Rhinovirus / Enterovirus DETECTED (A) NOT DETECTED Final   Influenza A NOT DETECTED NOT DETECTED Final   Influenza B NOT DETECTED NOT DETECTED Final   Parainfluenza Virus 1 NOT DETECTED NOT DETECTED Final   Parainfluenza Virus 2 NOT DETECTED NOT DETECTED Final   Parainfluenza Virus 3 NOT DETECTED NOT DETECTED Final   Parainfluenza Virus 4 NOT DETECTED NOT DETECTED Final   Respiratory Syncytial Virus NOT DETECTED NOT DETECTED Final   Bordetella pertussis NOT DETECTED NOT DETECTED Final   Bordetella Parapertussis NOT DETECTED NOT DETECTED Final   Chlamydophila pneumoniae NOT DETECTED NOT DETECTED Final   Mycoplasma pneumoniae NOT DETECTED NOT DETECTED Final    Comment: Performed at [facility], [address] [place], [place] [address]     Radiology Studies: DG CHEST PORT 1 VIEW Result Date: 04/10/2024 CLINICAL DATA:  Dyspnea EXAM: PORTABLE CHEST 1 VIEW COMPARISON:  04/08/2024 FINDINGS: Mild bibasilar atelectasis. Stable right cardiophrenic opacity corresponding to prominent epicardial fat better seen on CT examination of 04/09/2024. Lungs are otherwise clear. No pneumothorax or pleural effusion. Stable borderline cardiomegaly. Pulmonary vascularity is normal. No acute bone abnormality. IMPRESSION: 1. Mild bibasilar atelectasis. Electronically Signed   By: Worthy Heads M.D.   On: 04/10/2024 12:44   ECHOCARDIOGRAM COMPLETE Result Date: 04/09/2024    ECHOCARDIOGRAM REPORT   Patient Name:   Margaret Douglas Date of Exam: 04/09/2024 Medical Rec #:  [id number]        Height:       70.0 in Accession #:    [id number]       Weight:       230.0 lb Date of Birth:  04-Apr-1956       BSA:          2.215 m Patient Age:    67 years         BP:           125/72 mmHg Patient Gender: F                HR:           79 bpm. Exam Location:  Inpatient Procedure: 2D Echo, Cardiac Doppler and Color Doppler (Both Spectral and Color            Flow Doppler were utilized during procedure). Indications:    Dyspnea  History:        Patient has no prior history of Echocardiogram examinations.  Sonographer:    Juanita Shaw Referring Phys: [id number] ARSHAD N KAKRAKANDY IMPRESSIONS  1. Left ventricular ejection fraction, by estimation, is 35 to 40%. The left ventricle has moderately decreased function. The left ventricle demonstrates global hypokinesis with septal-lateral dyssynchrony suggestive of IVCD. Left ventricular diastolic parameters are consistent with Grade I diastolic dysfunction (impaired relaxation).  2. Right ventricular systolic function is normal.  The right ventricular size is normal. Tricuspid regurgitation signal is inadequate for assessing PA pressure.  3. The mitral valve is normal in structure. No evidence of mitral valve regurgitation. No evidence of mitral  stenosis.  4. The aortic valve was not well visualized. Aortic valve regurgitation is not visualized. No aortic stenosis is present.  5. The inferior vena cava is normal in size with greater than 50% respiratory variability, suggesting right atrial pressure of 3 mmHg.  6. Technically difficult study with poor acoustic windows. FINDINGS  Left Ventricle: Left ventricular ejection fraction, by estimation, is 35 to 40%. The left ventricle has moderately decreased function. The left ventricle demonstrates global hypokinesis. The left ventricular internal cavity size was normal in size. There is no left ventricular hypertrophy. Left ventricular diastolic parameters are consistent with Grade I diastolic dysfunction (impaired relaxation). Right Ventricle: The right ventricular size is normal. No increase in right ventricular wall thickness. Right ventricular systolic function is normal. Tricuspid regurgitation signal is inadequate for assessing PA pressure. Left Atrium: Left atrial size was normal in size. Right Atrium: Right atrial size was normal in size. Pericardium: Trivial pericardial effusion is present. Mitral Valve: The mitral valve is normal in structure. Mild to moderate mitral annular calcification. No evidence of mitral valve regurgitation. No evidence of mitral valve stenosis. MV peak gradient, 4.9 mmHg. The mean mitral valve gradient is 3.0 mmHg. Tricuspid Valve: The tricuspid valve is normal in structure. Tricuspid valve regurgitation is not demonstrated. Aortic Valve: The aortic valve was not well visualized. Aortic valve regurgitation is not visualized. No aortic stenosis is present. Aortic valve mean gradient measures 6.0 mmHg. Aortic valve peak gradient measures 10.0 mmHg. Aortic valve area, by VTI measures 1.68 cm. Pulmonic Valve: The pulmonic valve was not well visualized. Pulmonic valve regurgitation is not visualized. Aorta: The aortic root is normal in size and structure. Venous: The inferior vena  cava is normal in size with greater than 50% respiratory variability, suggesting right atrial pressure of 3 mmHg. IAS/Shunts: No atrial level shunt detected by color flow Doppler.  LEFT VENTRICLE PLAX 2D LVIDd:         5.50 cm      Diastology LVIDs:         3.70 cm      LV e' medial:    5.77 cm/s LV PW:         1.10 cm      LV E/e' medial:  10.5 LV IVS:        0.80 cm      LV e' lateral:   8.59 cm/s LVOT diam:     2.10 cm      LV E/e' lateral: 7.1 LV SV:         53 LV SV Index:   24 LVOT Area:     3.46 cm  LV Volumes (MOD) LV vol d, MOD A2C: 114.0 ml LV vol d, MOD A4C: 135.0 ml LV vol s, MOD A2C: 54.2 ml LV vol s, MOD A4C: 62.9 ml LV SV MOD A2C:     59.8 ml LV SV MOD A4C:     135.0 ml LV SV MOD BP:      67.3 ml RIGHT VENTRICLE             IVC RV Basal diam:  2.80 cm     IVC diam: 0.90 cm RV Mid diam:    2.10 cm RV S prime:     10.60 cm/s TAPSE (M-mode): 2.0 cm LEFT  ATRIUM             Index        RIGHT ATRIUM          Index LA diam:        2.90 cm 1.31 cm/m   RA Area:     8.80 cm LA Vol (A2C):   41.6 ml 18.78 ml/m  RA Volume:   14.50 ml 6.55 ml/m LA Vol (A4C):   30.9 ml 13.95 ml/m LA Biplane Vol: 35.9 ml 16.21 ml/m  AORTIC VALVE                     PULMONIC VALVE AV Area (Vmax):    1.93 cm      PV Vmax:       0.74 m/s AV Area (Vmean):   1.59 cm      PV Peak grad:  2.2 mmHg AV Area (VTI):     1.68 cm AV Vmax:           158.00 cm/s AV Vmean:          119.000 cm/s AV VTI:            0.314 m AV Peak Grad:      10.0 mmHg AV Mean Grad:      6.0 mmHg LVOT Vmax:         87.90 cm/s LVOT Vmean:        54.500 cm/s LVOT VTI:          0.152 m LVOT/AV VTI ratio: 0.48  AORTA Ao Root diam: 3.00 cm Ao Asc diam:  3.10 cm MITRAL VALVE MV Area (PHT): 3.65 cm    SHUNTS MV Area VTI:   1.95 cm    Systemic VTI:  0.15 m MV Peak grad:  4.9 mmHg    Systemic Diam: 2.10 cm MV Mean grad:  3.0 mmHg MV Vmax:       1.11 m/s MV Vmean:      80.8 cm/s MV Decel Time: 208 msec MV E velocity: 60.60 cm/s MV A velocity: 95.20 cm/s MV E/A ratio:   0.64 Dalton McleanMD Electronically signed by Archer Bear Signature Date/Time: 04/09/2024/10:11:13 AM    Final    CT CHEST WO CONTRAST Result Date: 04/09/2024 CLINICAL DATA:  Pneumonia. EXAM: CT CHEST WITHOUT CONTRAST TECHNIQUE: Multidetector CT imaging of the chest was performed following the standard protocol without IV contrast. RADIATION DOSE REDUCTION: This exam was performed according to the departmental dose-optimization program which includes automated exposure control, adjustment of the mA and/or kV according to patient size and/or use of iterative reconstruction technique. COMPARISON:  01/29/2016 FINDINGS: Cardiovascular: The heart size is normal. No substantial pericardial effusion. Coronary artery calcification is evident. Mild atherosclerotic calcification is noted in the wall of the thoracic aorta. Mediastinum/Nodes: No mediastinal lymphadenopathy. No evidence for gross hilar lymphadenopathy although assessment is limited by the lack of intravenous contrast on the current study. The esophagus has normal imaging features. There is no axillary lymphadenopathy. Lungs/Pleura: No suspicious pulmonary nodule or mass. Dependent collapse/consolidation noted in both lower lobes, right greater than left. No pleural effusion. Upper Abdomen: The liver shows diffusely decreased attenuation suggesting fat deposition. Musculoskeletal: No worrisome lytic or sclerotic osseous abnormality. IMPRESSION: 1. Dependent collapse/consolidation in both lower lobes, right greater than left. Imaging features likely reflect atelectasis although a degree of superimposed pneumonia is not excluded. 2. Hepatic steatosis. 3.  Aortic Atherosclerosis (ICD10-I70.0). Electronically Signed   By: Zack Hero.D.  On: 04/09/2024 07:37   DG Chest Port 1 View Result Date: 04/08/2024 CLINICAL DATA:  Short of breath EXAM: PORTABLE CHEST 1 VIEW COMPARISON:  01/27/2006 FINDINGS: Single frontal view of the chest demonstrates an  unremarkable cardiac silhouette. There is diffuse increased interstitial prominence of uncertain acuity. No airspace disease, effusion, or pneumothorax. No acute bony abnormalities. IMPRESSION: 1. Diffuse increased interstitial prominence, which could be related to chronic interstitial scarring, atypical infection such as viral pneumonitis, or developing interstitial edema. Electronically Signed   By: Bobbye Burrow M.D.   On: 04/08/2024 22:04   Scheduled Meds:  arformoterol  15 mcg Nebulization BID   budesonide (PULMICORT) nebulizer solution  0.25 mg Nebulization BID   enoxaparin  (LOVENOX ) injection  40 mg Subcutaneous Daily   guaiFENesin  1,200 mg Oral BID   ipratropium  0.5 mg Nebulization Q8H   levalbuterol  0.63 mg Nebulization Q8H   levothyroxine  100 mcg Oral Q0600   losartan   25 mg Oral Daily   methylPREDNISolone  (SOLU-MEDROL ) injection  80 mg Intravenous Daily   metoprolol  succinate  12.5 mg Oral Daily   spironolactone   12.5 mg Oral Daily   Continuous Infusions:  cefTRIAXone  (ROCEPHIN )  IV 2 g (04/10/24 1139)   doxycycline  (VIBRAMYCIN ) IV 100 mg (04/10/24 1221)    LOS: 1 day   Aura Leeds, DO [facility] Available via Epic secure chat 7am-7pm After these hours, please refer to coverage provider listed on [email or web address] 04/10/2024, 7:46 PM

## 2024-04-10 NOTE — Progress Notes (Signed)
 Cardiologist:  Audery Blazing  Subjective:  Denies SSCP, palpitations  Dyspnea improved   Objective:  Vitals:   04/09/24 0832 04/09/24 1433 04/09/24 2106 04/10/24 0512  BP: 125/72 135/67 (!) 145/84 119/72  Pulse: 73 75 72   Resp: 15 16 18 18   Temp: 97.6 F (36.4 C) 97.9 F (36.6 C) 97.7 F (36.5 C) (!) 97.5 F (36.4 C)  TempSrc: Oral Oral Oral Oral  SpO2: 95% 91% 91% 93%  Weight:        Intake/Output from previous day:  Intake/Output Summary (Last 24 hours) at 04/10/2024 0847 Last data filed at 04/10/2024 0710 Gross per 24 hour  Intake 980 ml  Output --  Net 980 ml    Physical Exam:  Affect appropriate Healthy:  appears stated age HEENT: normal Neck supple with no adenopathy JVP normal no bruits no thyromegaly Lungs COPD end exp wheezing  Heart:  S1/S2 no murmur, no rub, gallop or click PMI normal Abdomen: benighn, BS positve, no tenderness, no AAA no bruit.  No HSM or HJR Distal pulses intact with no bruits No edema Neuro non-focal Skin warm and dry No muscular weakness   Lab Results: Basic Metabolic Panel: Recent Labs    04/09/24 0900 04/10/24 0639  NA 137 134*  K 3.8 4.4  CL 95* 97*  CO2 26 26  GLUCOSE 161* 97  BUN 26* 30*  CREATININE 1.00 1.00  CALCIUM 9.5 9.3  MG 2.1  --   PHOS 3.8  --    Liver Function Tests: Recent Labs    04/09/24 0859 04/09/24 0900  AST 74* 74*  ALT 40 40  ALKPHOS 65 63  BILITOT 1.2 1.2  PROT 6.2* 6.1*  ALBUMIN 3.9 3.9   No results for input(s): "LIPASE", "AMYLASE" in the last 72 hours. CBC: Recent Labs    04/08/24 2206 04/08/24 2253 04/09/24 0859  WBC 5.7  --  6.4  NEUTROABS  --   --  6.0  HGB 17.0* 18.0* 16.0*  HCT 48.8* 53.0* 45.5  MCV 104.9*  --  104.8*  PLT 225  --  208   Cardiac Enzymes: No results for input(s): "CKTOTAL", "CKMB", "CKMBINDEX", "TROPONINI" in the last 72 hours. BNP: Invalid input(s): "POCBNP" D-Dimer: Recent Labs    04/09/24 0859  DDIMER 0.64*    Thyroid  Function  Tests: Recent Labs    04/09/24 0900  TSH 23.736*     Imaging: ECHOCARDIOGRAM COMPLETE Result Date: 04/09/2024    ECHOCARDIOGRAM REPORT   Patient Name:   Margaret Douglas Date of Exam: 04/09/2024 Medical Rec #:  [id number]        Height:       70.0 in Accession #:    [id number]       Weight:       230.0 lb Date of Birth:  December 15, 1955       BSA:          2.215 m Patient Age:    68 years         BP:           125/72 mmHg Patient Gender: F                HR:           79 bpm. Exam Location:  Inpatient Procedure: 2D Echo, Cardiac Doppler and Color Doppler (Both Spectral and Color            Flow Doppler were utilized during procedure). Indications:    Dyspnea  History:        Patient has no prior history of Echocardiogram examinations.  Sonographer:    Juanita Shaw Referring Phys: [id number] ARSHAD N KAKRAKANDY IMPRESSIONS  1. Left ventricular ejection fraction, by estimation, is 35 to 40%. The left ventricle has moderately decreased function. The left ventricle demonstrates global hypokinesis with septal-lateral dyssynchrony suggestive of IVCD. Left ventricular diastolic parameters are consistent with Grade I diastolic dysfunction (impaired relaxation).  2. Right ventricular systolic function is normal. The right ventricular size is normal. Tricuspid regurgitation signal is inadequate for assessing PA pressure.  3. The mitral valve is normal in structure. No evidence of mitral valve regurgitation. No evidence of mitral stenosis.  4. The aortic valve was not well visualized. Aortic valve regurgitation is not visualized. No aortic stenosis is present.  5. The inferior vena cava is normal in size with greater than 50% respiratory variability, suggesting right atrial pressure of 3 mmHg.  6. Technically difficult study with poor acoustic windows. FINDINGS  Left Ventricle: Left ventricular ejection fraction, by estimation, is 35 to 40%. The left ventricle has moderately decreased function. The left ventricle demonstrates  global hypokinesis. The left ventricular internal cavity size was normal in size. There is no left ventricular hypertrophy. Left ventricular diastolic parameters are consistent with Grade I diastolic dysfunction (impaired relaxation). Right Ventricle: The right ventricular size is normal. No increase in right ventricular wall thickness. Right ventricular systolic function is normal. Tricuspid regurgitation signal is inadequate for assessing PA pressure. Left Atrium: Left atrial size was normal in size. Right Atrium: Right atrial size was normal in size. Pericardium: Trivial pericardial effusion is present. Mitral Valve: The mitral valve is normal in structure. Mild to moderate mitral annular calcification. No evidence of mitral valve regurgitation. No evidence of mitral valve stenosis. MV peak gradient, 4.9 mmHg. The mean mitral valve gradient is 3.0 mmHg. Tricuspid Valve: The tricuspid valve is normal in structure. Tricuspid valve regurgitation is not demonstrated. Aortic Valve: The aortic valve was not well visualized. Aortic valve regurgitation is not visualized. No aortic stenosis is present. Aortic valve mean gradient measures 6.0 mmHg. Aortic valve peak gradient measures 10.0 mmHg. Aortic valve area, by VTI measures 1.68 cm. Pulmonic Valve: The pulmonic valve was not well visualized. Pulmonic valve regurgitation is not visualized. Aorta: The aortic root is normal in size and structure. Venous: The inferior vena cava is normal in size with greater than 50% respiratory variability, suggesting right atrial pressure of 3 mmHg. IAS/Shunts: No atrial level shunt detected by color flow Doppler.  LEFT VENTRICLE PLAX 2D LVIDd:         5.50 cm      Diastology LVIDs:         3.70 cm      LV e' medial:    5.77 cm/s LV PW:         1.10 cm      LV E/e' medial:  10.5 LV IVS:        0.80 cm      LV e' lateral:   8.59 cm/s LVOT diam:     2.10 cm      LV E/e' lateral: 7.1 LV SV:         53 LV SV Index:   24 LVOT Area:     3.46  cm  LV Volumes (MOD) LV vol d, MOD A2C: 114.0 ml LV vol d, MOD A4C: 135.0 ml LV vol s, MOD A2C: 54.2 ml LV vol s, MOD A4C: 62.9 ml  LV SV MOD A2C:     59.8 ml LV SV MOD A4C:     135.0 ml LV SV MOD BP:      67.3 ml RIGHT VENTRICLE             IVC RV Basal diam:  2.80 cm     IVC diam: 0.90 cm RV Mid diam:    2.10 cm RV S prime:     10.60 cm/s TAPSE (M-mode): 2.0 cm LEFT ATRIUM             Index        RIGHT ATRIUM          Index LA diam:        2.90 cm 1.31 cm/m   RA Area:     8.80 cm LA Vol (A2C):   41.6 ml 18.78 ml/m  RA Volume:   14.50 ml 6.55 ml/m LA Vol (A4C):   30.9 ml 13.95 ml/m LA Biplane Vol: 35.9 ml 16.21 ml/m  AORTIC VALVE                     PULMONIC VALVE AV Area (Vmax):    1.93 cm      PV Vmax:       0.74 m/s AV Area (Vmean):   1.59 cm      PV Peak grad:  2.2 mmHg AV Area (VTI):     1.68 cm AV Vmax:           158.00 cm/s AV Vmean:          119.000 cm/s AV VTI:            0.314 m AV Peak Grad:      10.0 mmHg AV Mean Grad:      6.0 mmHg LVOT Vmax:         87.90 cm/s LVOT Vmean:        54.500 cm/s LVOT VTI:          0.152 m LVOT/AV VTI ratio: 0.48  AORTA Ao Root diam: 3.00 cm Ao Asc diam:  3.10 cm MITRAL VALVE MV Area (PHT): 3.65 cm    SHUNTS MV Area VTI:   1.95 cm    Systemic VTI:  0.15 m MV Peak grad:  4.9 mmHg    Systemic Diam: 2.10 cm MV Mean grad:  3.0 mmHg MV Vmax:       1.11 m/s MV Vmean:      80.8 cm/s MV Decel Time: 208 msec MV E velocity: 60.60 cm/s MV A velocity: 95.20 cm/s MV E/A ratio:  0.64 Dalton McleanMD Electronically signed by Archer Bear Signature Date/Time: 04/09/2024/10:11:13 AM    Final    CT CHEST WO CONTRAST Result Date: 04/09/2024 CLINICAL DATA:  Pneumonia. EXAM: CT CHEST WITHOUT CONTRAST TECHNIQUE: Multidetector CT imaging of the chest was performed following the standard protocol without IV contrast. RADIATION DOSE REDUCTION: This exam was performed according to the departmental dose-optimization program which includes automated exposure control, adjustment of the  mA and/or kV according to patient size and/or use of iterative reconstruction technique. COMPARISON:  01/29/2016 FINDINGS: Cardiovascular: The heart size is normal. No substantial pericardial effusion. Coronary artery calcification is evident. Mild atherosclerotic calcification is noted in the wall of the thoracic aorta. Mediastinum/Nodes: No mediastinal lymphadenopathy. No evidence for gross hilar lymphadenopathy although assessment is limited by the lack of intravenous contrast on the current study. The esophagus has normal imaging features. There is no axillary lymphadenopathy. Lungs/Pleura: No suspicious  pulmonary nodule or mass. Dependent collapse/consolidation noted in both lower lobes, right greater than left. No pleural effusion. Upper Abdomen: The liver shows diffusely decreased attenuation suggesting fat deposition. Musculoskeletal: No worrisome lytic or sclerotic osseous abnormality. IMPRESSION: 1. Dependent collapse/consolidation in both lower lobes, right greater than left. Imaging features likely reflect atelectasis although a degree of superimposed pneumonia is not excluded. 2. Hepatic steatosis. 3.  Aortic Atherosclerosis (ICD10-I70.0). Electronically Signed   By: Donnal Fusi M.D.   On: 04/09/2024 07:37   DG Chest Port 1 View Result Date: 04/08/2024 CLINICAL DATA:  Short of breath EXAM: PORTABLE CHEST 1 VIEW COMPARISON:  01/27/2006 FINDINGS: Single frontal view of the chest demonstrates an unremarkable cardiac silhouette. There is diffuse increased interstitial prominence of uncertain acuity. No airspace disease, effusion, or pneumothorax. No acute bony abnormalities. IMPRESSION: 1. Diffuse increased interstitial prominence, which could be related to chronic interstitial scarring, atypical infection such as viral pneumonitis, or developing interstitial edema. Electronically Signed   By: Bobbye Burrow M.D.   On: 04/08/2024 22:04    Cardiac Studies:  ECG: SR LBBB rate 86   Telemetry:  NSR    Echo: EF 35-40% no sig valve dx normal RV  Medications:    enoxaparin  (LOVENOX ) injection  40 mg Subcutaneous Daily   losartan   25 mg Oral Daily   methylPREDNISolone  (SOLU-MEDROL ) injection  80 mg Intravenous Daily   metoprolol  succinate  12.5 mg Oral Daily   spironolactone   12.5 mg Oral Daily      cefTRIAXone  (ROCEPHIN )  IV 2 g (04/09/24 0600)   doxycycline  (VIBRAMYCIN ) IV Stopped (04/10/24 0010)    Assessment/Plan:   DCM:  ? Due to prior chemoRx and etoh abuse. Continue losartan  , toprol  and aldactone  per Dr Audery Blazing will have outpatient evaluation for CAD Hypothyroid: per primary service needs synthroid not started yet TSH 23.7 COPD: dyspnea more related to this BNP only 48 on antibiotics per primary service with solumedrol  needs to stop smoking CT with dependent collapse/consolidation of both lower lobes needs IS   Janelle Mediate 04/10/2024, 8:47 AM

## 2024-04-10 NOTE — Hospital Course (Signed)
 Patient is a 68 year old  68 y.o. female with history of prior breast cancer status postmastectomy and radiation, ongoing tobacco abuse presents to the ER because of worsening shortness of breath.  Patient states she has been having productive cough shortness of breath and wheezing for the last 1 month.  Had gone to the urgent care about a week ago when patient was prescribed doxycycline  for sinusitis.  Despite taking which patient is still having productive cough and shortness of breath which has been acutely worsening.  Denies any chest pain fever or chills.  Denies any recent travel or sick contacts.  Has not been to a physician for many years now.  Further workup revealed a Rhino Enterovirus CAP and suspected COPD exacerbation.  ECHO done and showed a DCM so Cardiology consulted and adjusting medications. Continues to be SOB and CXR showed worsening Pleural Effusion so given a dose of IV Lasix 5/5. Slowly improving and weaning of Solumedrol. Will need Ambulatory Home O2 Screen.  Hospitalization also complicated by Abnormal LFTs so w/u initiated for that as well.  Assessment and Plan:  Acute Respiratory Failure with Hypoxia in the setting of community-acquired Pneumonia and Suspected Acute Exacerbation of COPD from Rhino/Enterovirus: Presently on 5 L oxygen with chest x-ray showing bilateral infiltrates we will keep patient on empiric antibiotics for pneumonia. D-dimer mildly elevated at 0.64. Check sputum cultures. Respiratory viral panel + for Rhino/Enterovirus. CT Chest done and showed " Dependent collapse/consolidation in both lower lobes, right greater than left. Imaging features likely reflect atelectasis although a degree of superimposed pneumonia is not excluded.Hepatic steatosis. Aortic Atherosclerosis" Flutter Valve, Incentive Spirometry, Guaifenesin 1200 mg po BID. Xopenex/Atrovent q8h. Brovana and Budesonide. C/w Abx with Doxy and Ceftriaxone . C/w Steroids and decrease Solumedrol IV 60 mg BID  to IV 60 mg daily.  -CXR 04/12/24 showed Pleural Effusion so given a dose of IV Lasix 40 mg x1 and repeat CXR 04/13/24 showed significantly smaller Pleural effusion and Left Lingular Subsegmental ATX -Will need Ambulatory Home O2 Screen prior to D/C. Check CXR in the AM. She will need outpatient PFTs and Pulmonary F/U. OOB and check PT/OT recommending H/H. 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 Could be related to her prior chemotherapy or alcohol abuse cardiology initiated Metoprolol  Succinate 12.5 mg po daily, and Spironolactone  12.5 mg po daily. Cardiology initiating Entresto 24-26 twice daily if blood pressure allows.  Cards recommending outpt CTA when she is improved from her respiratory illness. ECHO done showed EF of 35-40% with moderately decreased fxn and LV demonstrating global hypokinesis w/ septal-lateral dyssynchrony suggestive of IVCD. Left ventricular diastolic parameters are consistent with Grade I diastolic dysfunction (impaired relaxation). -Lipid Panel done and showed LDL of 69. CXR as above and BNP went from 48.4 -> 122.8 -> 145.7. Give a 1x dose of IV Lasix  on 04/12/24.   Arrhythmias: Noted on Tele. Consist w/ LBBB w/ return to normal QRS intervals. Cardiology following  Abnormal LFTs: Trending Up and ? Worsening in the setting of Solumedrol. AST is now 174 and ALT is now 98. RUQ U/S showed fatty liver disease along with cholelithiasis and GB sludge w/o complicating factors.Acute Hepatitis Panel Negative. CTM and Trend Hepatic Fxn Panel and repeat CMP in the AM  Renal Insuffiencey: BUN/Cr went from 27/1.09 -> 22/0.84 -> 24/1.03. Avoid Nephrotoxic Medications, Contrast Dyes, Hypotension and Dehydration to Ensure Adequate Renal Perfusion  and will need to Renally Adjust Meds. CTM and Trend Renal Function carefully and repeat CMP in the AM    Prior history of Breast Cancer: S/p Lumpectomy and Radiation.  Hypothyroidism: TSH 23.736 and Free T4 was <0.25. Reinitiated Levothyroxine at 100 mcg po Daily   Erthrocytosis and Macrocytosis: Hgb/Hct remains elevated and is 19.7/55.5 w/ MCV of 103.9. Erythrocytosis likely 2/2 to Tobacco Abuse. Folate Level and B12 being checked given that she drinks EtOH.  Class I Obesity: Complicates overall prognosis and care. Estimated body mass index is 33 kg/m as calculated from the following:   Height as of 03/06/20: 5\' 10"  (1.778 m).   Weight as of this encounter: 104.3 kg. Weight Loss and Dietary Counseling given

## 2024-04-11 ENCOUNTER — Inpatient Hospital Stay (HOSPITAL_COMMUNITY)

## 2024-04-11 DIAGNOSIS — J9601 Acute respiratory failure with hypoxia: Secondary | ICD-10-CM | POA: Diagnosis not present

## 2024-04-11 DIAGNOSIS — Z853 Personal history of malignant neoplasm of breast: Secondary | ICD-10-CM

## 2024-04-11 DIAGNOSIS — J206 Acute bronchitis due to rhinovirus: Secondary | ICD-10-CM | POA: Diagnosis not present

## 2024-04-11 DIAGNOSIS — I420 Dilated cardiomyopathy: Secondary | ICD-10-CM | POA: Diagnosis not present

## 2024-04-11 DIAGNOSIS — N183 Chronic kidney disease, stage 3 unspecified: Secondary | ICD-10-CM | POA: Diagnosis not present

## 2024-04-11 LAB — CBC WITH DIFFERENTIAL/PLATELET
Abs Immature Granulocytes: 0.03 10*3/uL (ref 0.00–0.07)
Basophils Absolute: 0 10*3/uL (ref 0.0–0.1)
Basophils Relative: 0 %
Eosinophils Absolute: 0 10*3/uL (ref 0.0–0.5)
Eosinophils Relative: 0 %
HCT: 45.4 % (ref 36.0–46.0)
Hemoglobin: 15.6 g/dL — ABNORMAL HIGH (ref 12.0–15.0)
Immature Granulocytes: 0 %
Lymphocytes Relative: 12 %
Lymphs Abs: 0.9 10*3/uL (ref 0.7–4.0)
MCH: 36.5 pg — ABNORMAL HIGH (ref 26.0–34.0)
MCHC: 34.4 g/dL (ref 30.0–36.0)
MCV: 106.3 fL — ABNORMAL HIGH (ref 80.0–100.0)
Monocytes Absolute: 0.5 10*3/uL (ref 0.1–1.0)
Monocytes Relative: 6 %
Neutro Abs: 6.2 10*3/uL (ref 1.7–7.7)
Neutrophils Relative %: 82 %
Platelets: 167 10*3/uL (ref 150–400)
RBC: 4.27 MIL/uL (ref 3.87–5.11)
RDW: 16.8 % — ABNORMAL HIGH (ref 11.5–15.5)
WBC: 7.6 10*3/uL (ref 4.0–10.5)
nRBC: 0 % (ref 0.0–0.2)

## 2024-04-11 LAB — COMPREHENSIVE METABOLIC PANEL WITH GFR
ALT: 46 U/L — ABNORMAL HIGH (ref 0–44)
AST: 100 U/L — ABNORMAL HIGH (ref 15–41)
Albumin: 3.5 g/dL (ref 3.5–5.0)
Alkaline Phosphatase: 63 U/L (ref 38–126)
Anion gap: 11 (ref 5–15)
BUN: 27 mg/dL — ABNORMAL HIGH (ref 8–23)
CO2: 27 mmol/L (ref 22–32)
Calcium: 9.1 mg/dL (ref 8.9–10.3)
Chloride: 99 mmol/L (ref 98–111)
Creatinine, Ser: 0.82 mg/dL (ref 0.44–1.00)
GFR, Estimated: >60 mL/min (ref >60)
Glucose, Bld: 91 mg/dL (ref 70–99)
Potassium: 4.2 mmol/L (ref 3.5–5.1)
Sodium: 137 mmol/L (ref 135–145)
Total Bilirubin: 1.2 mg/dL (ref 0.0–1.2)
Total Protein: 5.9 g/dL — ABNORMAL LOW (ref 6.5–8.1)

## 2024-04-11 LAB — MAGNESIUM: Magnesium: 2.1 mg/dL (ref 1.7–2.4)

## 2024-04-11 LAB — PHOSPHORUS: Phosphorus: 2.2 mg/dL — ABNORMAL LOW (ref 2.5–4.6)

## 2024-04-11 MED ORDER — LEVALBUTEROL HCL 0.63 MG/3ML IN NEBU
0.6300 mg | INHALATION_SOLUTION | Freq: Three times a day (TID) | RESPIRATORY_TRACT | Status: DC
  Administered 2024-04-11 – 2024-04-13 (×2): 0.63 mg via RESPIRATORY_TRACT
  Filled 2024-04-11 (×3): qty 3

## 2024-04-11 MED ORDER — IPRATROPIUM BROMIDE 0.02 % IN SOLN
0.5000 mg | Freq: Three times a day (TID) | RESPIRATORY_TRACT | Status: DC
  Administered 2024-04-11 – 2024-04-13 (×2): 0.5 mg via RESPIRATORY_TRACT
  Filled 2024-04-11 (×3): qty 2.5

## 2024-04-11 MED ORDER — ENSURE ENLIVE PO LIQD
237.0000 mL | Freq: Two times a day (BID) | ORAL | Status: DC
  Administered 2024-04-11 (×2): 237 mL via ORAL

## 2024-04-11 MED ORDER — DOXYCYCLINE HYCLATE 100 MG PO TABS
100.0000 mg | ORAL_TABLET | Freq: Two times a day (BID) | ORAL | Status: DC
  Administered 2024-04-11 – 2024-04-15 (×9): 100 mg via ORAL
  Filled 2024-04-11 (×9): qty 1

## 2024-04-11 MED ORDER — METHYLPREDNISOLONE SODIUM SUCC 125 MG IJ SOLR
60.0000 mg | Freq: Two times a day (BID) | INTRAMUSCULAR | Status: DC
  Administered 2024-04-11 – 2024-04-13 (×4): 60 mg via INTRAVENOUS
  Filled 2024-04-11 (×4): qty 2

## 2024-04-11 NOTE — Progress Notes (Signed)
 Cardiologist:  Audery Blazing  Subjective:  Denies SSCP, palpitations  Dyspnea improved  Still with very congestive dry cough   Objective:  Vitals:   04/10/24 2102 04/11/24 0418 04/11/24 0811 04/11/24 0819  BP: 107/66 116/73 129/76   Pulse: (!) 58 (!) 57 60   Resp: 15 18 (!) 187 (!) 24  Temp: 98.5 F (36.9 C) 98.1 F (36.7 C) 97.6 F (36.4 C)   TempSrc:  Oral Oral   SpO2: 92% 95% 92%   Weight:        Intake/Output from previous day:  Intake/Output Summary (Last 24 hours) at 04/11/2024 1010 Last data filed at 04/11/2024 4098 Gross per 24 hour  Intake 697.62 ml  Output 550 ml  Net 147.62 ml    Physical Exam:  Affect appropriate Healthy:  appears stated age HEENT: normal Neck supple with no adenopathy JVP normal no bruits no thyromegaly Lungs COPD end exp wheezing with rhonchi Heart:  S1/S2 no murmur, no rub, gallop or click PMI normal Abdomen: benighn, BS positve, no tenderness, no AAA no bruit.  No HSM or HJR Distal pulses intact with no bruits No edema Neuro non-focal Skin warm and dry No muscular weakness   Lab Results: Basic Metabolic Panel: Recent Labs    04/10/24 1537 04/10/24 2112 04/11/24 0541  NA  --  136 137  K  --  4.2 4.2  CL  --  96* 99  CO2  --  29 27  GLUCOSE  --  142* 91  BUN  --  27* 27*  CREATININE  --  0.89 0.82  CALCIUM  --  9.4 9.1  MG  --  2.1 2.1  PHOS 2.5  --  2.2*   Liver Function Tests: Recent Labs    04/10/24 2112 04/11/24 0541  AST 105* 100*  ALT 48* 46*  ALKPHOS 66 63  BILITOT 1.0 1.2  PROT 6.0* 5.9*  ALBUMIN 3.7 3.5   No results for input(s): "LIPASE", "AMYLASE" in the last 72 hours. CBC: Recent Labs    04/10/24 1101 04/11/24 0541  WBC 8.0 7.6  NEUTROABS 6.6 6.2  HGB 16.6* 15.6*  HCT 48.3* 45.4  MCV 107.3* 106.3*  PLT 175 167   Cardiac Enzymes: No results for input(s): "CKTOTAL", "CKMB", "CKMBINDEX", "TROPONINI" in the last 72 hours. BNP: Invalid input(s): "POCBNP" D-Dimer: Recent Labs     04/09/24 0859  DDIMER 0.64*    Thyroid  Function Tests: Recent Labs    04/09/24 0900  TSH 23.736*     Imaging: DG CHEST PORT 1 VIEW Result Date: 04/11/2024 CLINICAL DATA:  Shortness of breath. EXAM: PORTABLE CHEST 1 VIEW COMPARISON:  04/10/2024 FINDINGS: Stable heart size. Stable opacity in the right cardiophrenic angle corresponding to prominent epicardial fat pad seen on prior CT. The lungs are otherwise clear. Old fracture deformity of left clavicle again noted. IMPRESSION: Stable exam with prominent epicardial fat pad. No active lung disease. Electronically Signed   By: Marlyce Sine M.D.   On: 04/11/2024 09:49   DG CHEST PORT 1 VIEW Result Date: 04/10/2024 CLINICAL DATA:  Dyspnea EXAM: PORTABLE CHEST 1 VIEW COMPARISON:  04/08/2024 FINDINGS: Mild bibasilar atelectasis. Stable right cardiophrenic opacity corresponding to prominent epicardial fat better seen on CT examination of 04/09/2024. Lungs are otherwise clear. No pneumothorax or pleural effusion. Stable borderline cardiomegaly. Pulmonary vascularity is normal. No acute bone abnormality. IMPRESSION: 1. Mild bibasilar atelectasis. Electronically Signed   By: Worthy Heads M.D.   On: 04/10/2024 12:44    Cardiac Studies:  ECG: SR LBBB rate 86   Telemetry:  NSR   Echo: EF 35-40% no sig valve dx normal RV  Medications:    arformoterol  15 mcg Nebulization BID   budesonide (PULMICORT) nebulizer solution  0.25 mg Nebulization BID   doxycycline   100 mg Oral Q12H   enoxaparin  (LOVENOX ) injection  40 mg Subcutaneous Daily   feeding supplement  237 mL Oral BID BM   guaiFENesin  1,200 mg Oral BID   ipratropium  0.5 mg Nebulization Q8H   levalbuterol  0.63 mg Nebulization Q8H   levothyroxine  100 mcg Oral Q0600   losartan   25 mg Oral Daily   methylPREDNISolone  (SOLU-MEDROL ) injection  80 mg Intravenous Daily   metoprolol  succinate  12.5 mg Oral Daily   spironolactone   12.5 mg Oral Daily      cefTRIAXone  (ROCEPHIN )  IV 2 g  (04/11/24 0933)    Assessment/Plan:   DCM:  ? Due to prior chemoRx and etoh abuse. Continue losartan  , toprol  and aldactone  per Dr Audery Blazing will have outpatient evaluation for CAD Hypothyroid: per primary service needs synthroid started at 100ug daily repeat TSH in 8 weeks or so  COPD: dyspnea more related to this BNP only 48 on antibiotics per primary service with solumedrol  needs to stop smoking CT with dependent collapse/consolidation of both lower lobes needs IS   Janelle Mediate 04/11/2024, 10:10 AM

## 2024-04-11 NOTE — Progress Notes (Signed)
 This patient is receiving the antibiotic doxycycline by the intravenous route.   Based on criteria approved by the Pharmacy and Therapeutics Committee, and the  Infectious Disease Division, the antibiotic(s) is / are being converted to equivalent oral dose form(s). These criteria include:  Patient being treated for a respiratory tract infection, urinary tract infection, cellulitis, or Clostridium Difficile associated diarrhea  The patient is not neutropenic and does not exhibit a GI malabsorption state  The patient is eating (either orally or per tube) and/or has been taking other orally administered medications for at least 24 hours.  The patient is improving clinically (physician assessment and a 24-hour Tmax of 100.5 F).   If you have questions about this conversion, please contact the pharmacy department.   Thank you for allowing pharmacy to be a part of this patient's care.  Thelma Barge, PharmD, BCPS Clinical Pharmacist

## 2024-04-11 NOTE — Progress Notes (Signed)
    Called by the patient's nurse due to her having arrhythmias on telemetry. Reviewed telemetry and appears most consistent with her known left bundle branch block which is intermittent at times with return to normal QRS interval and heart rate has been in the 50's to 60's. No significant ventricular arrhythmias at this time.  Signed, Dorma Gash, PA-C 04/11/2024, 1:53 PM

## 2024-04-11 NOTE — Progress Notes (Signed)
 Pt stated she does not want her breathing treatments scheduled for 0000. Pt stated she just wants to sleep right now. Pt also refused BID Pulmicort and brovana at 2017.

## 2024-04-11 NOTE — Progress Notes (Signed)
 PROGRESS NOTE    Margaret Douglas  WUJ:[id number] DOB: [birth year] DOA: 04/08/2024 PCP: Monique Ano, MD   Brief Narrative:  Patient is a 68 year old  68 y.o. female with history of prior breast cancer status postmastectomy and radiation, ongoing tobacco abuse presents to the ER because of worsening shortness of breath.  Patient states she has been having productive cough shortness of breath and wheezing for the last 1 month.  Had gone to the urgent care about a week ago when patient was prescribed doxycycline  for sinusitis.  Despite taking which patient is still having productive cough and shortness of breath which has been acutely worsening.  Denies any chest pain fever or chills.  Denies any recent travel or sick contacts.  Has not been to a physician for many years now.  Further workup revealed a Rhino Enterovirus CAP and COPD exacerbation and ECHO done and showed a DCM so Cardiology consulted.  Assessment and Plan:  Acute Respiratory Failure with Hypoxia in the setting of community-acquired Pneumonia and Suspected Acute Exacerbation of COPD from Rhino/Enterovirus: Presently on 5 L oxygen with chest x-ray showing bilateral infiltrates we will keep patient on empiric antibiotics for pneumonia. D-dimer mildly elevated at 0.64. Check sputum cultures. Respiratory viral panel + for Rhino/Enterovirus. CT Chest done and showed " Dependent collapse/consolidation in both lower lobes, right greater than left. Imaging features likely reflect atelectasis although a degree of superimposed pneumonia is not excluded.Hepatic steatosis. Aortic Atherosclerosis" Flutter Valve, Incentive Spirometry, Guaifenesin 1200 mg po BID. Xopenex/Atrovent q8h. Brovana and Budesonide. C/w Abx with Doxy and Ceftriaxone . C/w Steroids and increase from IV 80 mg q Daily and and change to IV 60 mg BID. Will need Ambulatory Home O2 Screen prior to D/C. Check CXR in the AM. Repeat CXR today showed Stable exam with prominent  epicardial fat pad. No active lung disease. 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  Could be related to her prior chemotherapy or alcohol abuse cardiology initiated Metoprolol  Succinate 12.5 mg po daily, Losartan  25 mg po Daily and Spironolactone  12.5 mg po daily. Cardiology are recommending initiating Entresto 24-26 twice daily if blood pressure allows.  They are recommending outpatient CTA when she is improved from her respiratory illness. ECHO done showed EF of 35-40% with moderately decreased fxn and LV demonstrating global hypokinesis w/ septal-lateral dyssynchrony  suggestive of IVCD. Left ventricular diastolic parameters are consistent with Grade I diastolic dysfunction (impaired relaxation).   Arrhythmias: Noted on Tele. Consist w/ LBBB w/ return to normal QRS intervals. Cardiology following  Renal Insuffiencey: BUN/Cr Trend: Recent Labs  Lab 04/08/24 2255 04/09/24 0859 04/09/24 0900 04/10/24 0639 04/10/24 2112 04/11/24 0541  BUN 27* 27* 26* 30* 27* 27*  CREATININE 1.09* 0.99 1.00 1.00 0.89 0.82  -Avoid Nephrotoxic Medications, Contrast Dyes, Hypotension and Dehydration to Ensure Adequate Renal Perfusion and will need to Renally Adjust Meds. CTM and Trend Renal Function carefully and repeat CMP in the AM   Prior history of Breast Cancer: S/p lumpectomy and radiation.  Hypothyroidism: TSH 23.736 and Free T4 was <0.25. Start Levothyroxine at 100 mcg po Daily   Erthrocytosis and Macrocytosis: Hgb/Hct went from 18.0/53.0 -> 16.0/45.5 -> 16.6/48.3 -> 15.6/45.4; MCV went from 104.9 -> 104.8 -> 107.3 -> 106.3. Erythrocytosis likely 2/2 to Tobacco Abuse. Folate Level and B12 being checked given that she drinks EtOH.  Class I Obesity: Complicates overall prognosis and care. Estimated body mass index  is 33 kg/m as calculated from the following:   Height as of 03/06/20: 5\' 10"   (1.778 m).   Weight as of this encounter: 104.3 kg. Weight Loss and Dietary Counseling given   DVT prophylaxis: enoxaparin  (LOVENOX ) injection 40 mg Start: 04/09/24 1000    Code Status: Full Code Family Communication: No family present @ bedside  Disposition Plan:  Level of care: Telemetry Medical Status is: Inpatient Remains inpatient appropriate because: Needs further clinical improvement in Respiratory status   Consultants:  Cardiology  Procedures:  ECHOCardiogram  Antimicrobials:  Anti-infectives (From admission, onward)    Start     Dose/Rate Route Frequency Ordered Stop   04/11/24 1000  doxycycline  (VIBRA -TABS) tablet 100 mg        100 mg Oral Every 12 hours 04/11/24 0733     04/09/24 0645  doxycycline  (VIBRAMYCIN ) 100 mg in sodium chloride  0.9 % 250 mL IVPB  Status:  Discontinued        100 mg 125 mL/hr over 120 Minutes Intravenous 2 times daily 04/09/24 0605 04/11/24 0733   04/09/24 0600  cefTRIAXone  (ROCEPHIN ) 2 g in sodium chloride  0.9 % 100 mL IVPB        2 g 200 mL/hr over 30 Minutes Intravenous Daily 04/09/24 0517 04/14/24 0959   04/09/24 0600  azithromycin  (ZITHROMAX ) 500 mg in sodium chloride  0.9 % 250 mL IVPB  Status:  Discontinued        500 mg 250 mL/hr over 60 Minutes Intravenous Daily 04/09/24 0517 04/09/24 0605       Subjective: Seen and examined at bedside continues to still feel short of breath.  No nausea or vomiting.  Still unable to cough up her sputum.  Remains on 3 L of supplemental oxygen via nasal care.  Denied any lightheadedness or dizziness.  No chest pain.  No other concerns or complaints at this time.  Objective: Vitals:   04/11/24 0418 04/11/24 0811 04/11/24 0819 04/11/24 1702  BP: 116/73 129/76  100/60  Pulse: (!) 57 60  (!) 58  Resp: 18 (!) 187 (!) 24 17  Temp: 98.1 F (36.7 C) 97.6 F (36.4 C)  97.6 F (36.4 C)  TempSrc: Oral Oral  Oral  SpO2: 95% 92%  94%  Weight:        Intake/Output Summary (Last 24 hours) at 04/11/2024  1857 Last data filed at 04/11/2024 1610 Gross per 24 hour  Intake 250 ml  Output 250 ml  Net 0 ml   Filed Weights   04/08/24 2139  Weight: 104.3 kg   Examination: Physical Exam:  Constitutional: WN/WD obese Caucasian female who continues to feel short of breath. Respiratory: Diminished to auscultation bilaterally with some coarse breath sounds and does have some wheezing and rhonchi.  No appreciable rales or crackles. Normal respiratory effort and patient is not tachypenic. No accessory muscle use.  Unlabored breathing but is wearing supplemental oxygen via nasal cannula at 3 L Cardiovascular: RRR, no murmurs / rubs / gallops. S1 and S2 auscultated.  Has mild lower extremity edema Abdomen: Soft, non-tender, distended secondary to body habitus bowel sounds positive.  GU: Deferred. Musculoskeletal: No clubbing / cyanosis of digits/nails. No joint deformity upper and lower extremities. Skin: No rashes, lesions, ulcers on a limited skin evaluation. No induration; Warm and dry.  Neurologic: CN 2-12 grossly intact with no focal deficits. Romberg sign and cerebellar reflexes not assessed.  Psychiatric: Normal judgment and insight. Alert and oriented x 3. Normal mood and appropriate affect.   Data  Reviewed: I have personally reviewed following labs and imaging studies  CBC: Recent Labs  Lab 04/08/24 2206 04/08/24 2253 04/09/24 0859 04/10/24 1101 04/11/24 0541  WBC 5.7  --  6.4 8.0 7.6  NEUTROABS  --   --  6.0 6.6 6.2  HGB 17.0* 18.0* 16.0* 16.6* 15.6*  HCT 48.8* 53.0* 45.5 48.3* 45.4  MCV 104.9*  --  104.8* 107.3* 106.3*  PLT 225  --  208 175 167   Basic Metabolic Panel: Recent Labs  Lab 04/09/24 0859 04/09/24 0900 04/10/24 0639 04/10/24 1537 04/10/24 2112 04/11/24 0541  NA 137 137 134*  --  136 137  K 3.8 3.8 4.4  --  4.2 4.2  CL 95* 95* 97*  --  96* 99  CO2 26 26 26   --  29 27  GLUCOSE 162* 161* 97  --  142* 91  BUN 27* 26* 30*  --  27* 27*  CREATININE 0.99 1.00 1.00   --  0.89 0.82  CALCIUM 9.6 9.5 9.3  --  9.4 9.1  MG  --  2.1  --   --  2.1 2.1  PHOS  --  3.8  --  2.5  --  2.2*   GFR: CrCl cannot be calculated (Unknown ideal weight.). Liver Function Tests: Recent Labs  Lab 04/08/24 2255 04/09/24 0859 04/09/24 0900 04/10/24 2112 04/11/24 0541  AST 91* 74* 74* 105* 100*  ALT 41 40 40 48* 46*  ALKPHOS 67 65 63 66 63  BILITOT 1.7* 1.2 1.2 1.0 1.2  PROT 6.5 6.2* 6.1* 6.0* 5.9*  ALBUMIN 4.0 3.9 3.9 3.7 3.5   No results for input(s): "LIPASE", "AMYLASE" in the last 168 hours. No results for input(s): "AMMONIA" in the last 168 hours. Coagulation Profile: No results for input(s): "INR", "PROTIME" in the last 168 hours. Cardiac Enzymes: No results for input(s): "CKTOTAL", "CKMB", "CKMBINDEX", "TROPONINI" in the last 168 hours. BNP (last 3 results) No results for input(s): "PROBNP" in the last 8760 hours. HbA1C: No results for input(s): "HGBA1C" in the last 72 hours. CBG: No results for input(s): "GLUCAP" in the last 168 hours. Lipid Profile: No results for input(s): "CHOL", "HDL", "LDLCALC", "TRIG", "CHOLHDL", "LDLDIRECT" in the last 72 hours. Thyroid  Function Tests: Recent Labs    04/09/24 0900 04/10/24 1537  TSH 23.736*  --   FREET4  --  <0.25*   Anemia Panel: No results for input(s): "VITAMINB12", "FOLATE", "FERRITIN", "TIBC", "IRON", "RETICCTPCT" in the last 72 hours. Sepsis Labs: No results for input(s): "PROCALCITON", "LATICACIDVEN" in the last 168 hours.  Recent Results (from the past 240 hours)  Resp panel by RT-PCR (RSV, Flu A&B, Covid) Anterior Nasal Swab     Status: None   Collection Time: 04/08/24  9:47 PM   Specimen: Anterior Nasal Swab  Result Value Ref Range Status   SARS Coronavirus 2 by RT PCR NEGATIVE NEGATIVE Final   Influenza A by PCR NEGATIVE NEGATIVE Final   Influenza B by PCR NEGATIVE NEGATIVE Final    Comment: (NOTE) The Xpert Xpress SARS-CoV-2/FLU/RSV plus assay is intended as an aid in the diagnosis of  influenza from Nasopharyngeal swab specimens and should not be used as a sole basis for treatment. Nasal washings and aspirates are unacceptable for Xpert Xpress SARS-CoV-2/FLU/RSV testing.  Fact Sheet for Patients: BloggerCourse.com  Fact Sheet for Healthcare Providers: SeriousBroker.it  This test is not yet approved or cleared by the [place]  FDA and has been authorized for detection and/or diagnosis of SARS-CoV-2 by FDA under  an Emergency Use Authorization (EUA). This EUA will remain in effect (meaning this test can be used) for the duration of the COVID-19 declaration under Section 564(b)(1) of the Act, 21 U.S.C. section 360bbb-3(b)(1), unless the authorization is terminated or revoked.     Resp Syncytial Virus by PCR NEGATIVE NEGATIVE Final    Comment: (NOTE) Fact Sheet for Patients: BloggerCourse.com  Fact Sheet for Healthcare Providers: SeriousBroker.it  This test is not yet approved or cleared by the [place]  FDA and has been authorized for detection and/or diagnosis of SARS-CoV-2 by FDA under an Emergency Use Authorization (EUA). This EUA will remain in effect (meaning this test can be used) for the duration of the COVID-19 declaration under Section 564(b)(1) of the Act, 21 U.S.C. section 360bbb-3(b)(1), unless the authorization is terminated or revoked.  Performed at [facility], [address] [place], [place] [address]   Respiratory (~20 pathogens) panel by PCR     Status: Abnormal   Collection Time: 04/08/24  9:47 PM   Specimen: Nasopharyngeal Swab; Respiratory  Result Value Ref Range Status   Adenovirus NOT DETECTED NOT DETECTED Final   Coronavirus 229E NOT DETECTED NOT DETECTED Final    Comment: (NOTE) The Coronavirus on the Respiratory Panel, DOES NOT test for the novel  Coronavirus (2019 nCoV)    Coronavirus HKU1 NOT DETECTED NOT  DETECTED Final   Coronavirus NL63 NOT DETECTED NOT DETECTED Final   Coronavirus OC43 NOT DETECTED NOT DETECTED Final   Metapneumovirus NOT DETECTED NOT DETECTED Final   Rhinovirus / Enterovirus DETECTED (A) NOT DETECTED Final   Influenza A NOT DETECTED NOT DETECTED Final   Influenza B NOT DETECTED NOT DETECTED Final   Parainfluenza Virus 1 NOT DETECTED NOT DETECTED Final   Parainfluenza Virus 2 NOT DETECTED NOT DETECTED Final   Parainfluenza Virus 3 NOT DETECTED NOT DETECTED Final   Parainfluenza Virus 4 NOT DETECTED NOT DETECTED Final   Respiratory Syncytial Virus NOT DETECTED NOT DETECTED Final   Bordetella pertussis NOT DETECTED NOT DETECTED Final   Bordetella Parapertussis NOT DETECTED NOT DETECTED Final   Chlamydophila pneumoniae NOT DETECTED NOT DETECTED Final   Mycoplasma pneumoniae NOT DETECTED NOT DETECTED Final    Comment: Performed at [facility], [address] [place], [place] [address]    Radiology Studies: DG CHEST PORT 1 VIEW Result Date: 04/11/2024 CLINICAL DATA:  Shortness of breath. EXAM: PORTABLE CHEST 1 VIEW COMPARISON:  04/10/2024 FINDINGS: Stable heart size. Stable opacity in the right cardiophrenic angle corresponding to prominent epicardial fat pad seen on prior CT. The lungs are otherwise clear. Old fracture deformity of left clavicle again noted. IMPRESSION: Stable exam with prominent epicardial fat pad. No active lung disease. Electronically Signed   By: Marlyce Sine M.D.   On: 04/11/2024 09:49   DG CHEST PORT 1 VIEW Result Date: 04/10/2024 CLINICAL DATA:  Dyspnea EXAM: PORTABLE CHEST 1 VIEW COMPARISON:  04/08/2024 FINDINGS: Mild bibasilar atelectasis. Stable right cardiophrenic opacity corresponding to prominent epicardial fat better seen on CT examination of 04/09/2024. Lungs are otherwise clear. No pneumothorax or pleural effusion. Stable borderline cardiomegaly. Pulmonary vascularity is normal. No acute bone abnormality. IMPRESSION: 1. Mild bibasilar  atelectasis. Electronically Signed   By: Worthy Heads M.D.   On: 04/10/2024 12:44   Scheduled Meds:  arformoterol  15 mcg Nebulization BID   budesonide (PULMICORT) nebulizer solution  0.25 mg Nebulization BID   doxycycline   100 mg Oral Q12H   enoxaparin  (LOVENOX ) injection  40 mg Subcutaneous  Daily   feeding supplement  237 mL Oral BID BM   guaiFENesin  1,200 mg Oral BID   ipratropium  0.5 mg Nebulization Q8H   levalbuterol  0.63 mg Nebulization Q8H   levothyroxine  100 mcg Oral Q0600   losartan   25 mg Oral Daily   methylPREDNISolone  (SOLU-MEDROL ) injection  60 mg Intravenous Q12H   metoprolol  succinate  12.5 mg Oral Daily   spironolactone   12.5 mg Oral Daily   Continuous Infusions:  cefTRIAXone  (ROCEPHIN )  IV 2 g (04/11/24 0933)    LOS: 2 days   Aura Leeds, DO [facility] Available via Epic secure chat 7am-7pm After these hours, please refer to coverage provider listed on [email or web address] 04/11/2024, 6:57 PM

## 2024-04-12 ENCOUNTER — Telehealth (HOSPITAL_COMMUNITY): Payer: Self-pay | Admitting: Pharmacy Technician

## 2024-04-12 ENCOUNTER — Inpatient Hospital Stay (HOSPITAL_COMMUNITY)

## 2024-04-12 ENCOUNTER — Other Ambulatory Visit (HOSPITAL_COMMUNITY): Payer: Self-pay

## 2024-04-12 DIAGNOSIS — I428 Other cardiomyopathies: Secondary | ICD-10-CM

## 2024-04-12 DIAGNOSIS — J206 Acute bronchitis due to rhinovirus: Secondary | ICD-10-CM | POA: Diagnosis not present

## 2024-04-12 DIAGNOSIS — Z853 Personal history of malignant neoplasm of breast: Secondary | ICD-10-CM | POA: Diagnosis not present

## 2024-04-12 DIAGNOSIS — N183 Chronic kidney disease, stage 3 unspecified: Secondary | ICD-10-CM | POA: Diagnosis not present

## 2024-04-12 DIAGNOSIS — J9601 Acute respiratory failure with hypoxia: Secondary | ICD-10-CM | POA: Diagnosis not present

## 2024-04-12 LAB — PHOSPHORUS: Phosphorus: 1.9 mg/dL — ABNORMAL LOW (ref 2.5–4.6)

## 2024-04-12 LAB — CBC WITH DIFFERENTIAL/PLATELET
Abs Immature Granulocytes: 0.03 10*3/uL (ref 0.00–0.07)
Basophils Absolute: 0 10*3/uL (ref 0.0–0.1)
Basophils Relative: 0 %
Eosinophils Absolute: 0 10*3/uL (ref 0.0–0.5)
Eosinophils Relative: 0 %
HCT: 48.4 % — ABNORMAL HIGH (ref 36.0–46.0)
Hemoglobin: 17 g/dL — ABNORMAL HIGH (ref 12.0–15.0)
Immature Granulocytes: 0 %
Lymphocytes Relative: 7 %
Lymphs Abs: 0.5 10*3/uL — ABNORMAL LOW (ref 0.7–4.0)
MCH: 36.9 pg — ABNORMAL HIGH (ref 26.0–34.0)
MCHC: 35.1 g/dL (ref 30.0–36.0)
MCV: 105 fL — ABNORMAL HIGH (ref 80.0–100.0)
Monocytes Absolute: 0.2 10*3/uL (ref 0.1–1.0)
Monocytes Relative: 3 %
Neutro Abs: 6.1 10*3/uL (ref 1.7–7.7)
Neutrophils Relative %: 90 %
Platelets: 178 10*3/uL (ref 150–400)
RBC: 4.61 MIL/uL (ref 3.87–5.11)
RDW: 16.1 % — ABNORMAL HIGH (ref 11.5–15.5)
WBC: 6.9 10*3/uL (ref 4.0–10.5)
nRBC: 0 % (ref 0.0–0.2)

## 2024-04-12 LAB — COMPREHENSIVE METABOLIC PANEL WITH GFR
ALT: 65 U/L — ABNORMAL HIGH (ref 0–44)
AST: 131 U/L — ABNORMAL HIGH (ref 15–41)
Albumin: 3.6 g/dL (ref 3.5–5.0)
Alkaline Phosphatase: 67 U/L (ref 38–126)
Anion gap: 12 (ref 5–15)
BUN: 22 mg/dL (ref 8–23)
CO2: 26 mmol/L (ref 22–32)
Calcium: 9.4 mg/dL (ref 8.9–10.3)
Chloride: 98 mmol/L (ref 98–111)
Creatinine, Ser: 0.84 mg/dL (ref 0.44–1.00)
GFR, Estimated: >60 mL/min (ref >60)
Glucose, Bld: 123 mg/dL — ABNORMAL HIGH (ref 70–99)
Potassium: 4.5 mmol/L (ref 3.5–5.1)
Sodium: 136 mmol/L (ref 135–145)
Total Bilirubin: 0.9 mg/dL (ref 0.0–1.2)
Total Protein: 6 g/dL — ABNORMAL LOW (ref 6.5–8.1)

## 2024-04-12 LAB — LIPID PANEL
Cholesterol: 150 mg/dL (ref 0–200)
HDL: 75 mg/dL (ref >40)
LDL Cholesterol: 69 mg/dL (ref 0–99)
Total CHOL/HDL Ratio: 2 ratio
Triglycerides: 30 mg/dL (ref <150)
VLDL: 6 mg/dL (ref 0–40)

## 2024-04-12 LAB — MAGNESIUM: Magnesium: 2 mg/dL (ref 1.7–2.4)

## 2024-04-12 MED ORDER — SACUBITRIL-VALSARTAN 24-26 MG PO TABS
1.0000 | ORAL_TABLET | Freq: Two times a day (BID) | ORAL | Status: DC
  Administered 2024-04-12 – 2024-04-15 (×7): 1 via ORAL
  Filled 2024-04-12 (×7): qty 1

## 2024-04-12 MED ORDER — K PHOS MONO-SOD PHOS DI & MONO 155-852-130 MG PO TABS
500.0000 mg | ORAL_TABLET | Freq: Two times a day (BID) | ORAL | Status: AC
  Administered 2024-04-12 (×2): 500 mg via ORAL
  Filled 2024-04-12 (×2): qty 2

## 2024-04-12 MED ORDER — FUROSEMIDE 10 MG/ML IJ SOLN
40.0000 mg | Freq: Once | INTRAMUSCULAR | Status: AC
  Administered 2024-04-12: 40 mg via INTRAVENOUS
  Filled 2024-04-12: qty 4

## 2024-04-12 NOTE — Telephone Encounter (Addendum)
 Patient Product/process development scientist completed.    The patient is insured through [employer]. Patient has Medicare and is not eligible for a copay card, but may be able to apply for patient assistance or Medicare RX Payment Plan (Patient Must reach out to their plan, if eligible for payment plan), if available.    Ran test claim for Entresto 24-26 mg and the current 30 day co-pay is $0.00.  Ran test claim for Farxiga 10 mg and the current 30 day co-pay is $0.00.  Ran test claim for Jardiance 10 mg and the current 30 day co-pay is $0.00.  This test claim was processed through [facility]- copay amounts may vary at other pharmacies due to pharmacy/plan contracts, or as the patient moves through the different stages of their insurance plan.     Morgan Arab, CPHT Pharmacy Technician III Certified Patient Advocate [facility] Patient Advocate Team Direct Number: [phone]  Fax: [phone]

## 2024-04-12 NOTE — Progress Notes (Addendum)
   Patient Name: Margaret Douglas Date of Encounter: 04/12/2024 [facility] Cardiologist: Alexandria Angel, MD   Interval Summary  .    Patient reports feeling well today. Denies chest pain. Continues to be on supplemental oxygen via [place], not normally on oxygen   Vital Signs .    Vitals:   04/11/24 1702 04/11/24 1946 04/12/24 0517 04/12/24 0808  BP: 100/60 113/74 (P) 131/77 138/86  Pulse: (!) 58 (!) 57 (P) 62 66  Resp: 17 17 (P) 19   Temp: 97.6 F (36.4 C) 98 F (36.7 C) (P) 98.2 F (36.8 C) 97.6 F (36.4 C)  TempSrc: Oral Oral (P) Oral Oral  SpO2: 94% 94% (P) 94% 90%  Weight:        Intake/Output Summary (Last 24 hours) at 04/12/2024 0939 Last data filed at 04/12/2024 0500 Gross per 24 hour  Intake --  Output 200 ml  Net -200 ml      04/08/2024    9:39 PM 03/17/2020    8:50 AM 03/06/2020    9:21 AM  Last 3 Weights  Weight (lbs) 230 lb 211 lb 4.8 oz 210 lb 1.6 oz  Weight (kg) 104.327 kg 95.845 kg 95.3 kg      Telemetry/ECG    NSR  - Personally Reviewed  Physical Exam .   GEN: No acute distress.  Sitting upright in the bed  Neck: No JVD Cardiac:  RRR, no murmurs, rubs, or gallops.  Respiratory: Distant breath sounds but clear to auscultation bilaterally. GI: Soft, nontender, non-distended  MS: No edema in BLE   Assessment & Plan .     Acute on Chronic HFrEF - Echocardiogram this admission showed EF 35-40%, global hypokinesis with septal-lateral dyssynchrony suggestive of IVCD, grade I DD, normal RV systolic function - Possible that LV dysfunction related to prior chemo, alcohol abuse. Patient also has IVCD/LBBB, which may be contributing  - Plan for outpatient CTA when she improves from COPD flair  - Transition from losartan  to entresto 24-26 mg BID  - Continue metoprolol  succinate 12.5 mg daily  - continue spironolactone  12.5 mg daily  - Ordered lipid panel to assess risk factors for CAD. If elevated, consider ASA, statin  - Euvolemic on exam. BNP 48. No  need for diuresis   COPD  - BNP 48, no evidence of hypervolemia on exam  - Managed per primary   Otherwise per primary  - Hypothyroidism  - History of breast cancer  - COPD, pneumonia   For questions or updates, please contact [facility] Please consult [email or web address] for contact info under        Signed, Debria Fang, PA-C   I have personally seen and examined this patient. I agree with the assessment and plan as outlined above.  Reduced LV systolic function presumed to be non-ischemic from etoh use and prior chemotherapy. Her current admission is likely due to her COPD. No volume overload on exam.  Will continue Toprol  and start Entresto.  Continue spironolactone .  Plan outpatient coronary CTA to exclude CAD. She will need to be seen in our office post discharge and this can be arranged.    Antoinette Batman, MD, [facility] 04/12/2024 11:23 AM

## 2024-04-12 NOTE — Progress Notes (Signed)
 PROGRESS NOTE    Margaret Douglas  ZOX:[id number] DOB: Mar 17, 1956 DOA: 04/08/2024 PCP: Monique Ano, MD   Brief Narrative:  Patient is a 68 year old  68 y.o. female with history of prior breast cancer status postmastectomy and radiation, ongoing tobacco abuse presents to the ER because of worsening shortness of breath.  Patient states she has been having productive cough shortness of breath and wheezing for the last 1 month.  Had gone to the urgent care about a week ago when patient was prescribed doxycycline  for sinusitis.  Despite taking which patient is still having productive cough and shortness of breath which has been acutely worsening.  Denies any chest pain fever or chills.  Denies any recent travel or sick contacts.  Has not been to a physician for many years now.  Further workup revealed a Rhino Enterovirus CAP and COPD exacerbation and ECHO done and showed a DCM so Cardiology consulted and adjusting medications. Continues to be SOB and PT/OT  evaluate.  Assessment and Plan:  Acute Respiratory Failure with Hypoxia in the setting of community-acquired Pneumonia and Suspected Acute Exacerbation of COPD from Rhino/Enterovirus: Presently on 5 L oxygen with chest x-ray showing bilateral infiltrates we will keep patient on empiric antibiotics for pneumonia. D-dimer mildly elevated at 0.64. Check sputum cultures. Respiratory viral panel + for Rhino/Enterovirus. CT Chest done and showed " Dependent collapse/consolidation in both lower lobes, right greater than left. Imaging features likely reflect atelectasis although a degree of superimposed pneumonia is not excluded.Hepatic steatosis. Aortic Atherosclerosis" Flutter Valve, Incentive Spirometry, Guaifenesin 1200 mg po BID. Xopenex/Atrovent q8h. Brovana and Budesonide. C/w Abx with Doxy and Ceftriaxone . C/w Steroids and increase from IV 80 mg q Daily and and change to IV 60 mg BID. Will need Ambulatory Home O2 Screen prior to D/C. Check CXR in  the AM. Repeat CXR today showed that compared to yesterday, interval increase in peripheral lateral left pleural density/thickening, combined with increased blunting of the left costophrenic angle. This likely represents interval worsening of a mild to moderate left pleural effusion. Mild bilateral mid and lower lung interstitial thickening is unchanged. Will give a dose of IV Lasix 40 mg x1. She will need outpatient PFTs and Pulmonary F/U. 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  Could be related to her prior chemotherapy or alcohol abuse cardiology initiated Metoprolol  Succinate 12.5 mg po daily, and Spironolactone  12.5 mg po daily. Cardiology now stopping Losartan  25 mg po Daily and initiating Entresto 24-26 twice daily if blood pressure allows.  They are recommending outpatient CTA when she is improved from her respiratory illness. ECHO done showed EF of 35-40% with moderately decreased fxn and LV demonstrating global hypokinesis w/ septal-lateral dyssynchrony  suggestive of IVCD. Left ventricular diastolic parameters are consistent with Grade I diastolic dysfunction (impaired relaxation). Lipid Panel done and showed LDL of 69. CXR as above and BNP went from 48.4 -> 122.8. Give a 1x dose of IV Lasix and repeat BNP in the AM.   Arrhythmias: Noted on Tele. Consist w/ LBBB w/ return to normal QRS intervals. Cardiology following  Abnormal LFTs: Trending Up and ? Worsening in the setting of Solumedrol. AST is now 131 and ALT is now 65. Check RUQ U/S and Acute Hepatitis Panel. CTM and Trend Hepatic Fxn Panel and repeat CMP in the AM  Renal Insuffiencey: Improved. BUN/Cr went from 27/1.09 -> 22/0.84. Avoid Nephrotoxic Medications, Contrast Dyes, Hypotension and Dehydration to Ensure Adequate  Renal Perfusion and will need to Renally Adjust Meds. CTM and Trend Renal Function carefully and repeat CMP in the AM   Prior  history of Breast Cancer: S/p Lumpectomy and Radiation.  Hypothyroidism: TSH 23.736 and Free T4 was <0.25. Start Levothyroxine at 100 mcg po Daily   Erthrocytosis and Macrocytosis: Hgb/Hct went from 18.0/53.0 -> 16.0/45.5 -> 16.6/48.3 -> 15.6/45.4 -> 17.0/48.4; MCV went from 104.9 -> 104.8 -> 107.3 -> 106.3 -> 105.0. Erythrocytosis likely 2/2 to Tobacco Abuse. Folate Level and B12 being checked given that she drinks EtOH.  Class I Obesity: Complicates overall prognosis and care. Estimated body mass index is 33 kg/m as calculated from the following:   Height as of 03/06/20: 5\' 10"  (1.778 m).   Weight as of this encounter: 104.3 kg. Weight Loss and Dietary Counseling given   DVT prophylaxis: enoxaparin  (LOVENOX ) injection 40 mg Start: 04/09/24 1000    Code Status: Full Code Family Communication: No family present @ bedside  Disposition Plan:  Level of care: Telemetry Medical Status is: Inpatient Remains inpatient appropriate because: Needs further clinical improvement in her respiratory status   Consultants:  Cardiology  Procedures:  ECHOCARDIOGRAM  Antimicrobials:  Anti-infectives (From admission, onward)    Start     Dose/Rate Route Frequency Ordered Stop   04/11/24 1000  doxycycline  (VIBRA -TABS) tablet 100 mg        100 mg Oral Every 12 hours 04/11/24 0733     04/09/24 0645  doxycycline  (VIBRAMYCIN ) 100 mg in sodium chloride  0.9 % 250 mL IVPB  Status:  Discontinued        100 mg 125 mL/hr over 120 Minutes Intravenous 2 times daily 04/09/24 0605 04/11/24 0733   04/09/24 0600  cefTRIAXone  (ROCEPHIN ) 2 g in sodium chloride  0.9 % 100 mL IVPB        2 g 200 mL/hr over 30 Minutes Intravenous Daily 04/09/24 0517 04/14/24 0959   04/09/24 0600  azithromycin  (ZITHROMAX ) 500 mg in sodium chloride  0.9 % 250 mL IVPB  Status:  Discontinued        500 mg 250 mL/hr over 60 Minutes Intravenous Daily 04/09/24 0517 04/09/24 0605       Subjective: Seen and examined at bedside was still  feeling short of breath.  Felt very fatigued and tired.  Still coughing and unable to bring up her sputum.  Refused her breathing treatment earlier this morning but then agreeable.  No other concerns or complaints at this time.  Objective: Vitals:   04/11/24 1946 04/12/24 0517 04/12/24 0808 04/12/24 1427  BP: 113/74 (P) 131/77 138/86 131/81  Pulse: (!) 57 (P) 62 66 (!) 59  Resp: 17 (P) 19  20  Temp: 98 F (36.7 C) (P) 98.2 F (36.8 C) 97.6 F (36.4 C) (!) 97.4 F (36.3 C)  TempSrc: Oral (P) Oral Oral Oral  SpO2: 94% (P) 94% 90% 90%  Weight:        Intake/Output Summary (Last 24 hours) at 04/12/2024 1917 Last data filed at 04/12/2024 0500 Gross per 24 hour  Intake --  Output 200 ml  Net -200 ml   Filed Weights   04/08/24 2139  Weight: 104.3 kg   Examination: Physical Exam:  Constitutional: WN/WD obese chronically ill-appearing Caucasian female who continues to be short of breath Respiratory: Diminished to auscultation bilaterally with some coarse breath sound does have some wheezing and some rhonchi with some associated slight crackles.  Worse on the left compared to the right.  Normal respiratory effort and patient  is not tachypenic. No accessory muscle use.  Unlabored breathing and is wearing supplemental oxygen nasal cannula at at least 3 L Cardiovascular: RRR, no murmurs / rubs / gallops. S1 and S2 auscultated.  Has a mild lower extremity edema Abdomen: Soft, non-tender, distended secondary to body habitus. Bowel sounds positive.  GU: Deferred. Musculoskeletal: No clubbing / cyanosis of digits/nails. No joint deformity upper and lower extremities Skin: No rashes, lesions, ulcers on limited skin evaluation. No induration; Warm and dry.  Neurologic: CN 2-12 grossly intact with no focal deficits. Romberg sign and cerebellar reflexes not assessed.  Psychiatric: Normal judgment and insight. Alert and oriented x 3. Normal mood and appropriate affect.   Data Reviewed: I have  personally reviewed following labs and imaging studies  CBC: Recent Labs  Lab 04/08/24 2206 04/08/24 2253 04/09/24 0859 04/10/24 1101 04/11/24 0541 04/12/24 0651  WBC 5.7  --  6.4 8.0 7.6 6.9  NEUTROABS  --   --  6.0 6.6 6.2 6.1  HGB 17.0* 18.0* 16.0* 16.6* 15.6* 17.0*  HCT 48.8* 53.0* 45.5 48.3* 45.4 48.4*  MCV 104.9*  --  104.8* 107.3* 106.3* 105.0*  PLT 225  --  208 175 167 178   Basic Metabolic Panel: Recent Labs  Lab 04/09/24 0900 04/10/24 0639 04/10/24 1537 04/10/24 2112 04/11/24 0541 04/12/24 0651  NA 137 134*  --  136 137 136  K 3.8 4.4  --  4.2 4.2 4.5  CL 95* 97*  --  96* 99 98  CO2 26 26  --  29 27 26   GLUCOSE 161* 97  --  142* 91 123*  BUN 26* 30*  --  27* 27* 22  CREATININE 1.00 1.00  --  0.89 0.82 0.84  CALCIUM 9.5 9.3  --  9.4 9.1 9.4  MG 2.1  --   --  2.1 2.1 2.0  PHOS 3.8  --  2.5  --  2.2* 1.9*   GFR: CrCl cannot be calculated (Unknown ideal weight.). Liver Function Tests: Recent Labs  Lab 04/09/24 0859 04/09/24 0900 04/10/24 2112 04/11/24 0541 04/12/24 0651  AST 74* 74* 105* 100* 131*  ALT 40 40 48* 46* 65*  ALKPHOS 65 63 66 63 67  BILITOT 1.2 1.2 1.0 1.2 0.9  PROT 6.2* 6.1* 6.0* 5.9* 6.0*  ALBUMIN 3.9 3.9 3.7 3.5 3.6   No results for input(s): "LIPASE", "AMYLASE" in the last 168 hours. No results for input(s): "AMMONIA" in the last 168 hours. Coagulation Profile: No results for input(s): "INR", "PROTIME" in the last 168 hours. Cardiac Enzymes: No results for input(s): "CKTOTAL", "CKMB", "CKMBINDEX", "TROPONINI" in the last 168 hours. BNP (last 3 results) No results for input(s): "PROBNP" in the last 8760 hours. HbA1C: No results for input(s): "HGBA1C" in the last 72 hours. CBG: No results for input(s): "GLUCAP" in the last 168 hours. Lipid Profile: Recent Labs    04/12/24 1005  CHOL 150  HDL 75  LDLCALC 69  TRIG 30  CHOLHDL 2.0   Thyroid  Function Tests: Recent Labs    04/10/24 1537  FREET4 <0.25*   Anemia  Panel: No results for input(s): "VITAMINB12", "FOLATE", "FERRITIN", "TIBC", "IRON", "RETICCTPCT" in the last 72 hours. Sepsis Labs: No results for input(s): "PROCALCITON", "LATICACIDVEN" in the last 168 hours.  Recent Results (from the past 240 hours)  Resp panel by RT-PCR (RSV, Flu A&B, Covid) Anterior Nasal Swab     Status: None   Collection Time: 04/08/24  9:47 PM   Specimen: Anterior Nasal Swab  Result Value Ref Range Status   SARS Coronavirus 2 by RT PCR NEGATIVE NEGATIVE Final   Influenza A by PCR NEGATIVE NEGATIVE Final   Influenza B by PCR NEGATIVE NEGATIVE Final    Comment: (NOTE) The Xpert Xpress SARS-CoV-2/FLU/RSV plus assay is intended as an aid in the diagnosis of influenza from Nasopharyngeal swab specimens and should not be used as a sole basis for treatment. Nasal washings and aspirates are unacceptable for Xpert Xpress SARS-CoV-2/FLU/RSV testing.  Fact Sheet for Patients: BloggerCourse.com  Fact Sheet for Healthcare Providers: SeriousBroker.it  This test is not yet approved or cleared by the [place]  FDA and has been authorized for detection and/or diagnosis of SARS-CoV-2 by FDA under an Emergency Use Authorization (EUA). This EUA will remain in effect (meaning this test can be used) for the duration of the COVID-19 declaration under Section 564(b)(1) of the Act, 21 U.S.C. section 360bbb-3(b)(1), unless the authorization is terminated or revoked.     Resp Syncytial Virus by PCR NEGATIVE NEGATIVE Final    Comment: (NOTE) Fact Sheet for Patients: BloggerCourse.com  Fact Sheet for Healthcare Providers: SeriousBroker.it  This test is not yet approved or cleared by the [place]  FDA and has been authorized for detection and/or diagnosis of SARS-CoV-2 by FDA under an Emergency Use Authorization (EUA). This EUA will remain in effect (meaning this test can  be used) for the duration of the COVID-19 declaration under Section 564(b)(1) of the Act, 21 U.S.C. section 360bbb-3(b)(1), unless the authorization is terminated or revoked.  Performed at [facility], [address] [place], [place] [address]   Respiratory (~20 pathogens) panel by PCR     Status: Abnormal   Collection Time: 04/08/24  9:47 PM   Specimen: Nasopharyngeal Swab; Respiratory  Result Value Ref Range Status   Adenovirus NOT DETECTED NOT DETECTED Final   Coronavirus 229E NOT DETECTED NOT DETECTED Final    Comment: (NOTE) The Coronavirus on the Respiratory Panel, DOES NOT test for the novel  Coronavirus (2019 nCoV)    Coronavirus HKU1 NOT DETECTED NOT DETECTED Final   Coronavirus NL63 NOT DETECTED NOT DETECTED Final   Coronavirus OC43 NOT DETECTED NOT DETECTED Final   Metapneumovirus NOT DETECTED NOT DETECTED Final   Rhinovirus / Enterovirus DETECTED (A) NOT DETECTED Final   Influenza A NOT DETECTED NOT DETECTED Final   Influenza B NOT DETECTED NOT DETECTED Final   Parainfluenza Virus 1 NOT DETECTED NOT DETECTED Final   Parainfluenza Virus 2 NOT DETECTED NOT DETECTED Final   Parainfluenza Virus 3 NOT DETECTED NOT DETECTED Final   Parainfluenza Virus 4 NOT DETECTED NOT DETECTED Final   Respiratory Syncytial Virus NOT DETECTED NOT DETECTED Final   Bordetella pertussis NOT DETECTED NOT DETECTED Final   Bordetella Parapertussis NOT DETECTED NOT DETECTED Final   Chlamydophila pneumoniae NOT DETECTED NOT DETECTED Final   Mycoplasma pneumoniae NOT DETECTED NOT DETECTED Final    Comment: Performed at [facility], [address], [place], [place] [address]    Radiology Studies: DG CHEST PORT 1 VIEW Result Date: 04/12/2024 CLINICAL DATA:  Shortness of breath. EXAM: PORTABLE CHEST 1 VIEW COMPARISON:  AP chest 04/11/2024, 04/10/2024, 04/08/2024, 01/27/2006; CT chest 01/29/2016 FINDINGS: Cardiac silhouette is again at the upper limits of normal size. Mediastinal  contours are within normal limits. Moderate atherosclerotic calcifications. Compared to yesterday, interval increase in peripheral lateral left hemithorax density extending up to the mid height, combined with increased blunting of the left costophrenic angle. This presumably represents interval  worsening of a mild to moderate left pleural effusion. Prominent right and left epicardial fat pads as seen on prior CT. Mild bilateral mid and lower lung interstitial thickening is unchanged. Moderate multilevel degenerative disc changes of the thoracic spine. Old healed left clavicle midshaft fracture. IMPRESSION: 1. Compared to yesterday, interval increase in peripheral lateral left pleural density/thickening, combined with increased blunting of the left costophrenic angle. This likely represents interval worsening of a mild to moderate left pleural effusion. 2. Mild bilateral mid and lower lung interstitial thickening is unchanged. Electronically Signed   By: Bertina Broccoli M.D.   On: 04/12/2024 09:29   DG CHEST PORT 1 VIEW Result Date: 04/11/2024 CLINICAL DATA:  Shortness of breath. EXAM: PORTABLE CHEST 1 VIEW COMPARISON:  04/10/2024 FINDINGS: Stable heart size. Stable opacity in the right cardiophrenic angle corresponding to prominent epicardial fat pad seen on prior CT. The lungs are otherwise clear. Old fracture deformity of left clavicle again noted. IMPRESSION: Stable exam with prominent epicardial fat pad. No active lung disease. Electronically Signed   By: Marlyce Sine M.D.   On: 04/11/2024 09:49   Scheduled Meds:  arformoterol  15 mcg Nebulization BID   budesonide (PULMICORT) nebulizer solution  0.25 mg Nebulization BID   doxycycline   100 mg Oral Q12H   enoxaparin  (LOVENOX ) injection  40 mg Subcutaneous Daily   feeding supplement  237 mL Oral BID BM   furosemide  40 mg Intravenous Once   guaiFENesin  1,200 mg Oral BID   ipratropium  0.5 mg Nebulization Q8H   levalbuterol  0.63 mg Nebulization Q8H    levothyroxine  100 mcg Oral Q0600   methylPREDNISolone  (SOLU-MEDROL ) injection  60 mg Intravenous Q12H   metoprolol  succinate  12.5 mg Oral Daily   phosphorus  500 mg Oral BID   sacubitril-valsartan  1 tablet Oral BID   spironolactone   12.5 mg Oral Daily   Continuous Infusions:  cefTRIAXone  (ROCEPHIN )  IV 2 g (04/12/24 0933)    LOS: 3 days   Aura Leeds, DO [facility] Available via Epic secure chat 7am-7pm After these hours, please refer to coverage provider listed on [email or web address] 04/12/2024, 7:17 PM

## 2024-04-12 NOTE — Evaluation (Signed)
 Physical Therapy Evaluation Patient Details Name: Margaret Douglas MRN: [id number] DOB: [birth year] Today's Date: 04/12/2024  History of Present Illness  Pt is a 68 y.o. F who presents 04/08/2024 with worsening shortness of breath. Admitted with Hypoxia in the setting of community-acquired Pneumonia and Suspected Acute Exacerbation of COPD from Rhino/Enterovirus. Echo showing EF 35-40%. Significant PMH: prior breast cancer status postmastectomy and radiation, ongoing tobacco abuse.  Clinical Impression  PTA, pt lives with her niece and nephew, is a household ambulator using a RW and reports history of 2 recent falls. Pt presents with decreased functional mobility secondary to decreased cardiopulmonary endurance, weakness, impaired balance. Pt requiring min assist for transfers and ambulating to and from bathroom with a RW and CGA. Pt deferred further ambulation due to fatigue; reports she had previously walked in hallway today. Assisted back into bed. In light of deficits and recent falls, pt would benefit from follow up HHPT.   SATURATION QUALIFICATIONS: (This note is used to comply with regulatory documentation for home oxygen)  Patient Saturations on Room Air at Rest = 86%  Patient Saturations on Room Air while Ambulating = Deferred, < 88% on RA at rest  Patient Saturations on 3 Liters of oxygen while Ambulating = 91%  Please briefly explain why patient needs home oxygen:  To maintain oxygen saturations > 88% at rest and with ambulation      If plan is discharge home, recommend the following: A little help with walking and/or transfers;A little help with bathing/dressing/bathroom;Assistance with cooking/housework;Assist for transportation;Help with stairs or ramp for entrance   Can travel by private vehicle        Equipment Recommendations None recommended by PT  Recommendations for Other Services       Functional Status Assessment Patient has had a recent decline in their functional  status and demonstrates the ability to make significant improvements in function in a reasonable and predictable amount of time.     Precautions / Restrictions Precautions Precautions: Fall Restrictions Weight Bearing Restrictions Per Provider Order: No      Mobility  Bed Mobility Overal bed mobility: Needs Assistance Bed Mobility: Sit to Supine       Sit to supine: Supervision        Transfers Overall transfer level: Needs assistance Equipment used: Rolling walker (2 wheels) Transfers: Sit to/from Stand Sit to Stand: Min assist           General transfer comment: MinA to boost up from chair and toilet    Ambulation/Gait Ambulation/Gait assistance: Contact guard assist Gait Distance (Feet): 10 Feet (10", 10") Assistive device: Rolling walker (2 wheels) Gait Pattern/deviations: Step-through pattern, Decreased stride length       General Gait Details: Ambulating to and from bathroom with CGA  Stairs            Wheelchair Mobility     Tilt Bed    Modified Rankin (Stroke Patients Only)       Balance Overall balance assessment: Needs assistance Sitting-balance support: Feet supported Sitting balance-Leahy Scale: Good     Standing balance support: Bilateral upper extremity supported Standing balance-Leahy Scale: Poor                               Pertinent Vitals/Pain Pain Assessment Pain Assessment: No/denies pain    Home Living Family/patient expects to be discharged to:: Private residence Living Arrangements: Other relatives (niece, nephew) Available Help at Discharge: Family Type of  Home: House Home Access: Stairs to enter Entrance Stairs-Rails: Doctor, general practice of Steps: 3   Home Layout: One level Home Equipment: Agricultural consultant (2 wheels);Shower seat      Prior Function Prior Level of Function : Needs assist             Mobility Comments: reports is a household ambulator with RW, 2 falls  within past month ADLs Comments: requires intermittent assist from niece for ADL's     Extremity/Trunk Assessment   Upper Extremity Assessment Upper Extremity Assessment: Defer to OT evaluation    Lower Extremity Assessment Lower Extremity Assessment: Generalized weakness    Cervical / Trunk Assessment Cervical / Trunk Assessment: Normal  Communication   Communication Communication: No apparent difficulties    Cognition Arousal: Alert Behavior During Therapy: Flat affect   PT - Cognitive impairments: No apparent impairments                       PT - Cognition Comments: Flat affect, likely at baseline Following commands: Intact       Cueing       General Comments      Exercises     Assessment/Plan    PT Assessment Patient needs continued PT services  PT Problem List Decreased strength;Decreased activity tolerance;Decreased balance;Decreased mobility;Cardiopulmonary status limiting activity       PT Treatment Interventions DME instruction;Gait training;Stair training;Functional mobility training;Therapeutic activities;Therapeutic exercise;Balance training;Patient/family education    PT Goals (Current goals can be found in the Care Plan section)  Acute Rehab PT Goals Patient Stated Goal: to feel better PT Goal Formulation: With patient Time For Goal Achievement: 04/26/24 Potential to Achieve Goals: Good    Frequency Min 2X/week     Co-evaluation               AM-PAC PT "6 Clicks" Mobility  Outcome Measure Help needed turning from your back to your side while in a flat bed without using bedrails?: A Little Help needed moving from lying on your back to sitting on the side of a flat bed without using bedrails?: A Little Help needed moving to and from a bed to a chair (including a wheelchair)?: A Little Help needed standing up from a chair using your arms (e.g., wheelchair or bedside chair)?: A Little Help needed to walk in hospital room?:  A Little Help needed climbing 3-5 steps with a railing? : A Lot 6 Click Score: 17    End of Session Equipment Utilized During Treatment: Gait belt;Oxygen Activity Tolerance: Patient tolerated treatment well Patient left: in bed;with call bell/phone within reach;with bed alarm set Nurse Communication: Mobility status PT Visit Diagnosis: Unsteadiness on feet (R26.81);History of falling (Z91.81);Difficulty in walking, not elsewhere classified (R26.2)    Time: 1610-9604 PT Time Calculation (min) (ACUTE ONLY): 36 min   Charges:   PT Evaluation $PT Eval Low Complexity: 1 Low PT Treatments $Therapeutic Activity: 8-22 mins PT General Charges $$ ACUTE PT VISIT: 1 Visit         Verdia Glad, PT, DPT Acute Rehabilitation Services Office [phone]   Claria Crofts 04/12/2024, 1:13 PM

## 2024-04-12 NOTE — Evaluation (Signed)
 Occupational Therapy Evaluation Patient Details Name: Margaret Douglas MRN: [id number] DOB: 1956-12-03 Today's Date: 04/12/2024   History of Present Illness   Pt is a 68 y.o. F who presents 04/08/2024 with worsening shortness of breath. Admitted with Hypoxia in the setting of community-acquired Pneumonia and Suspected Acute Exacerbation of COPD from Rhino/Enterovirus. Echo showing EF 35-40%. Significant PMH: prior breast cancer status postmastectomy and radiation, ongoing tobacco abuse.     Clinical Impressions Pt reports using rollator for mobility, intermittent assist at home from family for ADLs, pt lives with niece. Pt currently with impaired activity tolerance, needing up to mod A for ADLs, CGA for bed mobility and min A for transfers with RW. Pt SpO2 down to 89% on RA seated EOB, incr to 90s on 2L while ambulating, back down to 88% upon return to bed and slowly incr to 90s on 2.5L O2 after ~2-3 mins and cues for PLB. Pt presenting with impairments listed below, will follow acutely. Recommend HHOT at d/c.     If plan is discharge home, recommend the following:   A little help with walking and/or transfers;A little help with bathing/dressing/bathroom;Assistance with cooking/housework;Direct supervision/assist for medications management;Direct supervision/assist for financial management;Assist for transportation;Help with stairs or ramp for entrance;Supervision due to cognitive status     Functional Status Assessment   Patient has had a recent decline in their functional status and demonstrates the ability to make significant improvements in function in a reasonable and predictable amount of time.     Equipment Recommendations   None recommended by OT     Recommendations for Other Services   PT consult     Precautions/Restrictions   Precautions Precautions: Fall Restrictions Weight Bearing Restrictions Per Provider Order: No     Mobility Bed Mobility Overal bed  mobility: Needs Assistance Bed Mobility: Sit to Supine, Supine to Sit     Supine to sit: Contact guard Sit to supine: Contact guard assist   General bed mobility comments: incr time & use of rails    Transfers Overall transfer level: Needs assistance Equipment used: Rolling walker (2 wheels) Transfers: Sit to/from Stand Sit to Stand: Min assist           General transfer comment: very elevated bed height; pt reports her bed is high at home      Balance Overall balance assessment: Needs assistance Sitting-balance support: Feet supported Sitting balance-Leahy Scale: Good     Standing balance support: Bilateral upper extremity supported Standing balance-Leahy Scale: Poor                             ADL either performed or assessed with clinical judgement   ADL Overall ADL's : Needs assistance/impaired Eating/Feeding: Set up;Sitting   Grooming: Set up;Sitting;Standing   Upper Body Bathing: Moderate assistance;Sitting   Lower Body Bathing: Sitting/lateral leans;Moderate assistance   Upper Body Dressing : Minimal assistance   Lower Body Dressing: Minimal assistance   Toilet Transfer: Contact guard assist;Ambulation;Rolling walker (2 wheels)                   Vision   Vision Assessment?: No apparent visual deficits     Perception Perception: Not tested       Praxis Praxis: Not tested       Pertinent Vitals/Pain Pain Assessment Pain Assessment: No/denies pain     Extremity/Trunk Assessment Upper Extremity Assessment Upper Extremity Assessment: Generalized weakness   Lower Extremity Assessment Lower Extremity  Assessment: Defer to PT evaluation   Cervical / Trunk Assessment Cervical / Trunk Assessment: Normal   Communication Communication Communication: No apparent difficulties   Cognition Arousal: Alert Behavior During Therapy: Flat affect Cognition: No apparent impairments             OT - Cognition Comments: pt  states "my lungs" when asked why she was brought to the hospital                 Following commands: Intact       Cueing  General Comments      SpO2 down to 89% on RA at rest, remained low 90s on 2L ambulating short distance in room, decr to 88-89% once seated in bed, and slowly came back up to 90s with cues for PLB   Exercises     Shoulder Instructions      Home Living Family/patient expects to be discharged to:: Private residence Living Arrangements: Other relatives Available Help at Discharge: Family Type of Home: House Home Access: Stairs to enter Secretary/administrator of Steps: 3 Entrance Stairs-Rails: Right;Left Home Layout: One level     Bathroom Shower/Tub: Walk-in shower         Home Equipment: Agricultural consultant (2 wheels);Educational psychologist (4 wheels)   Additional Comments: does not use o2 at baseline      Prior Functioning/Environment Prior Level of Function : Needs assist             Mobility Comments: reports is a household ambulator with RW, 2 falls within past month ADLs Comments: requires intermittent assist from niece for ADL's    OT Problem List: Decreased strength;Decreased range of motion;Decreased activity tolerance;Impaired balance (sitting and/or standing);Decreased cognition;Decreased coordination;Decreased safety awareness;Cardiopulmonary status limiting activity   OT Treatment/Interventions: Self-care/ADL training;Therapeutic exercise;Energy conservation;DME and/or AE instruction;Therapeutic activities;Patient/family education;Balance training      OT Goals(Current goals can be found in the care plan section)   Acute Rehab OT Goals Patient Stated Goal: to rest OT Goal Formulation: With patient Time For Goal Achievement: 04/26/24 Potential to Achieve Goals: Good ADL Goals Pt Will Perform Upper Body Dressing: with modified independence;sitting Pt Will Perform Lower Body Dressing: with modified independence;sitting/lateral  leans;sit to/from stand Pt Will Transfer to Toilet: with modified independence;ambulating;regular height toilet Pt Will Perform Tub/Shower Transfer: Shower transfer;with modified independence;ambulating Additional ADL Goal #1: pt will tolerate standing OOB activity x10 min in order to improve activity tolerance for ADLs   OT Frequency:  Min 2X/week    Co-evaluation              AM-PAC OT "6 Clicks" Daily Activity     Outcome Measure Help from another person eating meals?: A Little Help from another person taking care of personal grooming?: A Little Help from another person toileting, which includes using toliet, bedpan, or urinal?: A Little Help from another person bathing (including washing, rinsing, drying)?: A Lot Help from another person to put on and taking off regular upper body clothing?: A Little Help from another person to put on and taking off regular lower body clothing?: A Little 6 Click Score: 17   End of Session Equipment Utilized During Treatment: Gait belt;Rolling walker (2 wheels);Oxygen Nurse Communication: Mobility status  Activity Tolerance: Patient tolerated treatment well Patient left: in bed;with call bell/phone within reach;with bed alarm set  OT Visit Diagnosis: Unsteadiness on feet (R26.81);Other abnormalities of gait and mobility (R26.89);Muscle weakness (generalized) (M62.81)  Time: 1353-1416 OT Time Calculation (min): 23 min Charges:  OT General Charges $OT Visit: 1 Visit OT Evaluation $OT Eval Moderate Complexity: 1 Mod OT Treatments $Self Care/Home Management : 8-22 mins  Fred Hammes K, OTD, OTR/L SecureChat Preferred Acute Rehab [phone]   Aryan Sparks K Koonce 04/12/2024, 2:25 PM

## 2024-04-12 NOTE — Progress Notes (Signed)
 Heart Failure Navigator Progress Note  Assessed for Heart & Vascular TOC clinic readiness.  Patient does not meet criteria due to patient see's- [facility] . No HF TOC.   Navigator will sign off at this time.   Randie Bustle, BSN, Scientist, clinical (histocompatibility and immunogenetics) Only

## 2024-04-13 ENCOUNTER — Inpatient Hospital Stay (HOSPITAL_COMMUNITY)

## 2024-04-13 DIAGNOSIS — J9601 Acute respiratory failure with hypoxia: Secondary | ICD-10-CM | POA: Diagnosis not present

## 2024-04-13 DIAGNOSIS — I428 Other cardiomyopathies: Secondary | ICD-10-CM | POA: Diagnosis not present

## 2024-04-13 DIAGNOSIS — Z853 Personal history of malignant neoplasm of breast: Secondary | ICD-10-CM | POA: Diagnosis not present

## 2024-04-13 DIAGNOSIS — J206 Acute bronchitis due to rhinovirus: Secondary | ICD-10-CM | POA: Diagnosis not present

## 2024-04-13 DIAGNOSIS — N183 Chronic kidney disease, stage 3 unspecified: Secondary | ICD-10-CM | POA: Diagnosis not present

## 2024-04-13 LAB — CBC WITH DIFFERENTIAL/PLATELET
Abs Immature Granulocytes: 0.04 10*3/uL (ref 0.00–0.07)
Basophils Absolute: 0 10*3/uL (ref 0.0–0.1)
Basophils Relative: 0 %
Eosinophils Absolute: 0 10*3/uL (ref 0.0–0.5)
Eosinophils Relative: 0 %
HCT: 55.5 % — ABNORMAL HIGH (ref 36.0–46.0)
Hemoglobin: 19.7 g/dL — ABNORMAL HIGH (ref 12.0–15.0)
Immature Granulocytes: 1 %
Lymphocytes Relative: 7 %
Lymphs Abs: 0.5 10*3/uL — ABNORMAL LOW (ref 0.7–4.0)
MCH: 36.9 pg — ABNORMAL HIGH (ref 26.0–34.0)
MCHC: 35.5 g/dL (ref 30.0–36.0)
MCV: 103.9 fL — ABNORMAL HIGH (ref 80.0–100.0)
Monocytes Absolute: 0.2 10*3/uL (ref 0.1–1.0)
Monocytes Relative: 3 %
Neutro Abs: 6.6 10*3/uL (ref 1.7–7.7)
Neutrophils Relative %: 89 %
Platelets: 209 10*3/uL (ref 150–400)
RBC: 5.34 MIL/uL — ABNORMAL HIGH (ref 3.87–5.11)
RDW: 16 % — ABNORMAL HIGH (ref 11.5–15.5)
WBC: 7.4 10*3/uL (ref 4.0–10.5)
nRBC: 0 % (ref 0.0–0.2)

## 2024-04-13 LAB — COMPREHENSIVE METABOLIC PANEL WITH GFR
ALT: 98 U/L — ABNORMAL HIGH (ref 0–44)
AST: 174 U/L — ABNORMAL HIGH (ref 15–41)
Albumin: 4.4 g/dL (ref 3.5–5.0)
Alkaline Phosphatase: 86 U/L (ref 38–126)
Anion gap: 16 — ABNORMAL HIGH (ref 5–15)
BUN: 24 mg/dL — ABNORMAL HIGH (ref 8–23)
CO2: 29 mmol/L (ref 22–32)
Calcium: 9.2 mg/dL (ref 8.9–10.3)
Chloride: 92 mmol/L — ABNORMAL LOW (ref 98–111)
Creatinine, Ser: 1.03 mg/dL — ABNORMAL HIGH (ref 0.44–1.00)
GFR, Estimated: 59 mL/min — ABNORMAL LOW (ref >60)
Glucose, Bld: 143 mg/dL — ABNORMAL HIGH (ref 70–99)
Potassium: 3.8 mmol/L (ref 3.5–5.1)
Sodium: 137 mmol/L (ref 135–145)
Total Bilirubin: 1.4 mg/dL — ABNORMAL HIGH (ref 0.0–1.2)
Total Protein: 7.5 g/dL (ref 6.5–8.1)

## 2024-04-13 LAB — BRAIN NATRIURETIC PEPTIDE: B Natriuretic Peptide: 145.7 pg/mL — ABNORMAL HIGH (ref 0.0–100.0)

## 2024-04-13 LAB — HEPATITIS PANEL, ACUTE
HCV Ab: NONREACTIVE
Hep A IgM: NONREACTIVE
Hep B C IgM: NONREACTIVE
Hepatitis B Surface Ag: NONREACTIVE

## 2024-04-13 LAB — MAGNESIUM: Magnesium: 2.2 mg/dL (ref 1.7–2.4)

## 2024-04-13 LAB — PHOSPHORUS: Phosphorus: 3.6 mg/dL (ref 2.5–4.6)

## 2024-04-13 LAB — T3: T3, Total: <20 ng/dL — ABNORMAL LOW (ref 71–180)

## 2024-04-13 MED ORDER — METHYLPREDNISOLONE SODIUM SUCC 125 MG IJ SOLR
60.0000 mg | INTRAMUSCULAR | Status: DC
  Administered 2024-04-14: 60 mg via INTRAVENOUS
  Filled 2024-04-13: qty 2

## 2024-04-13 NOTE — Care Management Important Message (Signed)
 Important Message  Patient Details  Name: Margaret Douglas MRN: [id number] Date of Birth: 1956-11-02   Important Message Given:  Yes - Medicare IM     Felix Host 04/13/2024, 2:59 PM

## 2024-04-13 NOTE — Progress Notes (Signed)
 PROGRESS NOTE    Margaret Douglas  MVH:[id number] DOB: 07/12/1956 DOA: 04/08/2024 PCP: Monique Ano, MD   Brief Narrative:  Patient is a 68 year old  68 y.o. female with history of prior breast cancer status postmastectomy and radiation, ongoing tobacco abuse presents to the ER because of worsening shortness of breath.  Patient states she has been having productive cough shortness of breath and wheezing for the last 1 month.  Had gone to the urgent care about a week ago when patient was prescribed doxycycline  for sinusitis.  Despite taking which patient is still having productive cough and shortness of breath which has been acutely worsening.  Denies any chest pain fever or chills.  Denies any recent travel or sick contacts.  Has not been to a physician for many years now.  Further workup revealed a Rhino Enterovirus CAP and suspected COPD exacerbation.  ECHO done and showed a DCM so Cardiology consulted and adjusting medications. Continues to be SOB and CXR showed worsening Pleural Effusion so given a dose of IV Lasix 5/5. Slowly improving and weaning of Solumedrol. Will need Ambulatory Home O2 Screen.  Hospitalization also complicated by Abnormal LFTs so w/u initiated for that as well.  Assessment and Plan:  Acute Respiratory Failure with Hypoxia in the setting of community-acquired Pneumonia and Suspected Acute Exacerbation of COPD from Rhino/Enterovirus: Presently on 5 L oxygen with chest x-ray showing bilateral infiltrates we will keep patient on empiric antibiotics for pneumonia. D-dimer mildly elevated at 0.64. Check sputum cultures. Respiratory viral panel + for Rhino/Enterovirus. CT Chest done and showed " Dependent collapse/consolidation in both lower lobes, right greater than left. Imaging features likely reflect atelectasis although a degree of superimposed pneumonia is not excluded.Hepatic steatosis. Aortic Atherosclerosis" Flutter Valve, Incentive Spirometry, Guaifenesin 1200 mg po  BID. Xopenex/Atrovent q8h. Brovana and Budesonide. C/w Abx with Doxy and Ceftriaxone . C/w Steroids and decrease Solumedrol IV 60 mg BID to IV 60 mg daily.  -CXR 04/12/24 showed Pleural Effusion so given a dose of IV Lasix 40 mg x1 and repeat CXR 04/13/24 showed significantly smaller Pleural effusion and Left Lingular Subsegmental ATX -Will need Ambulatory Home O2 Screen prior to D/C. Check CXR in the AM. She will need outpatient PFTs and Pulmonary F/U. OOB and check PT/OT recommending H/H. 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 Could be related to her prior chemotherapy or alcohol abuse cardiology initiated Metoprolol  Succinate 12.5 mg po daily, and Spironolactone  12.5 mg po daily. Cardiology initiating Entresto 24-26 twice daily if blood pressure allows.  Cards recommending outpt CTA when she is improved from her respiratory illness. ECHO done showed EF of 35-40% with moderately decreased fxn and LV demonstrating global hypokinesis w/ septal-lateral dyssynchrony suggestive of IVCD. Left ventricular diastolic parameters are consistent with Grade I diastolic dysfunction (impaired relaxation). -Lipid Panel done and showed LDL of 69. CXR as above and BNP went from 48.4 -> 122.8 -> 145.7. Give a 1x dose of IV Lasix  on 04/12/24.   Arrhythmias: Noted on Tele. Consist w/ LBBB w/ return to normal QRS intervals. Cardiology following  Abnormal LFTs: Trending Up and ? Worsening in the setting of Solumedrol. AST is now 174 and ALT is now 98. RUQ U/S showed fatty liver disease along with cholelithiasis and GB sludge w/o complicating factors.Acute Hepatitis Panel Negative. CTM and Trend Hepatic Fxn Panel and repeat CMP in the AM  Renal Insuffiencey: BUN/Cr went from 27/1.09 -> 22/0.84 -> 24/1.03. Avoid Nephrotoxic Medications, Contrast Dyes, Hypotension and Dehydration to  Ensure Adequate Renal Perfusion and will need to Renally Adjust Meds. CTM and Trend Renal Function carefully and repeat CMP in the AM   Prior history of Breast Cancer: S/p Lumpectomy and Radiation.  Hypothyroidism: TSH 23.736 and Free T4 was <0.25. Reinitiated Levothyroxine at 100 mcg po Daily   Erthrocytosis and Macrocytosis: Hgb/Hct remains elevated and is 19.7/55.5 w/ MCV of 103.9. Erythrocytosis likely 2/2 to Tobacco Abuse. Folate Level and B12 being checked given that she drinks EtOH.  Class I Obesity: Complicates overall prognosis and care. Estimated body mass index is 33 kg/m as calculated from the following:   Height as of 03/06/20: 5\' 10"  (1.778 m).   Weight as of this encounter: 104.3 kg. Weight Loss and Dietary Counseling given   DVT prophylaxis: enoxaparin  (LOVENOX ) injection 40 mg Start: 04/09/24 1000    Code Status: Full Code Family Communication: D/w family present @ bedside  Disposition Plan:  Level of care: Telemetry Medical Status is: Inpatient Remains inpatient appropriate because: Will need Ambulatory Home O2 Screen and further improvement in respiratory status and clearance by Cardiology.    Consultants:  Cardiology  Procedures:  As delineated as above  Antimicrobials:  Anti-infectives (From admission, onward)    Start     Dose/Rate Route Frequency Ordered Stop   04/11/24 1000  doxycycline  (VIBRA -TABS) tablet 100 mg        100 mg Oral Every 12 hours 04/11/24 0733     04/09/24 0645  doxycycline  (VIBRAMYCIN ) 100 mg in sodium chloride  0.9 % 250 mL IVPB  Status:  Discontinued        100 mg 125 mL/hr over 120 Minutes Intravenous 2 times daily 04/09/24 0605 04/11/24 0733   04/09/24 0600  cefTRIAXone  (ROCEPHIN ) 2 g in sodium chloride  0.9 % 100 mL IVPB        2 g 200 mL/hr over 30 Minutes Intravenous Daily 04/09/24 0517 04/13/24 0854   04/09/24 0600  azithromycin  (ZITHROMAX ) 500 mg in sodium chloride  0.9 % 250 mL IVPB  Status:  Discontinued        500 mg 250  mL/hr over 60 Minutes Intravenous Daily 04/09/24 0517 04/09/24 0605       Subjective: Seen examined at bedside and is resting but continues to complain of shortness of breath.  States that is not really improved or worsened.  States that she does not wear oxygen at home and wants to know how she will qualify for oxygen if she needs it.  Denies any chest pain.  Unable to cough up her sputum.  Continues to not feel well.  No other concerns or complaints at this time  Objective: Vitals:   04/13/24 0747 04/13/24 1508 04/13/24 1929 04/13/24 1949  BP:  105/74  97/67  Pulse:  65  71  Resp:  18  17  Temp:  98.2 F (36.8 C)  98.8 F (37.1 C)  TempSrc:  Oral    SpO2: 93% 92% 93% 90%  Weight:        Intake/Output Summary (Last 24 hours) at 04/13/2024 1954 Last data filed at 04/12/2024 2100 Gross per 24 hour  Intake 25 ml  Output --  Net 25 ml   Filed Weights   04/08/24 2139  Weight: 104.3 kg   Examination: Physical Exam:  Constitutional: WN/WD Obese Caucasian female in NAD Respiratory: Diminished to auscultation bilaterally with coarse breath sound has some rhonchi  and crackles but no appreciable or crackles. Normal respiratory effort and patient is not tachypenic. No accessory muscle use. Wearing Supplemental O2 via [place].  Cardiovascular: RRR, no murmurs / rubs / gallops. S1 and S2 auscultated. Mild extremity edema. Abdomen: Soft, non-tender, Distended 2/2 body habitus. Bowel sounds positive.  GU: Deferred. Musculoskeletal: No clubbing / cyanosis of digits/nails. No joint deformity upper and lower extremities.  Skin: No rashes, lesions, ulcers on a limited skin evaluation. No induration; Warm and dry.  Neurologic: CN 2-12 grossly intact with no focal deficits. Romberg sign and cerebellar reflexes not assessed.  Psychiatric: Normal judgment and insight. Alert and oriented x 3. Normal mood and appropriate affect.   Data Reviewed: I have personally reviewed following labs and imaging  studies  CBC: Recent Labs  Lab 04/09/24 0859 04/10/24 1101 04/11/24 0541 04/12/24 0651 04/13/24 0644  WBC 6.4 8.0 7.6 6.9 7.4  NEUTROABS 6.0 6.6 6.2 6.1 6.6  HGB 16.0* 16.6* 15.6* 17.0* 19.7*  HCT 45.5 48.3* 45.4 48.4* 55.5*  MCV 104.8* 107.3* 106.3* 105.0* 103.9*  PLT 208 175 167 178 209   Basic Metabolic Panel: Recent Labs  Lab 04/09/24 0900 04/10/24 0639 04/10/24 1537 04/10/24 2112 04/11/24 0541 04/12/24 0651 04/13/24 0644  NA 137 134*  --  136 137 136 137  K 3.8 4.4  --  4.2 4.2 4.5 3.8  CL 95* 97*  --  96* 99 98 92*  CO2 26 26  --  29 27 26 29   GLUCOSE 161* 97  --  142* 91 123* 143*  BUN 26* 30*  --  27* 27* 22 24*  CREATININE 1.00 1.00  --  0.89 0.82 0.84 1.03*  CALCIUM 9.5 9.3  --  9.4 9.1 9.4 9.2  MG 2.1  --   --  2.1 2.1 2.0 2.2  PHOS 3.8  --  2.5  --  2.2* 1.9* 3.6   GFR: CrCl cannot be calculated (Unknown ideal weight.). Liver Function Tests: Recent Labs  Lab 04/09/24 0900 04/10/24 2112 04/11/24 0541 04/12/24 0651 04/13/24 0644  AST 74* 105* 100* 131* 174*  ALT 40 48* 46* 65* 98*  ALKPHOS 63 66 63 67 86  BILITOT 1.2 1.0 1.2 0.9 1.4*  PROT 6.1* 6.0* 5.9* 6.0* 7.5  ALBUMIN 3.9 3.7 3.5 3.6 4.4   No results for input(s): "LIPASE", "AMYLASE" in the last 168 hours. No results for input(s): "AMMONIA" in the last 168 hours. Coagulation Profile: No results for input(s): "INR", "PROTIME" in the last 168 hours. Cardiac Enzymes: No results for input(s): "CKTOTAL", "CKMB", "CKMBINDEX", "TROPONINI" in the last 168 hours. BNP (last 3 results) No results for input(s): "PROBNP" in the last 8760 hours. HbA1C: No results for input(s): "HGBA1C" in the last 72 hours. CBG: No results for input(s): "GLUCAP" in the last 168 hours. Lipid Profile: Recent Labs    04/12/24 1005  CHOL 150  HDL 75  LDLCALC 69  TRIG 30  CHOLHDL 2.0   Thyroid  Function Tests: No results for input(s): "TSH", "T4TOTAL", "FREET4", "T3FREE", "THYROIDAB" in the last 72  hours. Anemia Panel: No results for input(s): "VITAMINB12", "FOLATE", "FERRITIN", "TIBC", "IRON", "RETICCTPCT" in the last 72 hours. Sepsis Labs: No results for input(s): "PROCALCITON", "LATICACIDVEN" in the last 168 hours.  Recent Results (from the past 240 hours)  Resp panel by RT-PCR (RSV, Flu A&B, Covid) Anterior Nasal Swab     Status: None   Collection Time: 04/08/24  9:47 PM   Specimen: Anterior Nasal Swab  Result Value Ref Range  Status   SARS Coronavirus 2 by RT PCR NEGATIVE NEGATIVE Final   Influenza A by PCR NEGATIVE NEGATIVE Final   Influenza B by PCR NEGATIVE NEGATIVE Final    Comment: (NOTE) The Xpert Xpress SARS-CoV-2/FLU/RSV plus assay is intended as an aid in the diagnosis of influenza from Nasopharyngeal swab specimens and should not be used as a sole basis for treatment. Nasal washings and aspirates are unacceptable for Xpert Xpress SARS-CoV-2/FLU/RSV testing.  Fact Sheet for Patients: BloggerCourse.com  Fact Sheet for Healthcare Providers: SeriousBroker.it  This test is not yet approved or cleared by the [place]  FDA and has been authorized for detection and/or diagnosis of SARS-CoV-2 by FDA under an Emergency Use Authorization (EUA). This EUA will remain in effect (meaning this test can be used) for the duration of the COVID-19 declaration under Section 564(b)(1) of the Act, 21 U.S.C. section 360bbb-3(b)(1), unless the authorization is terminated or revoked.     Resp Syncytial Virus by PCR NEGATIVE NEGATIVE Final    Comment: (NOTE) Fact Sheet for Patients: BloggerCourse.com  Fact Sheet for Healthcare Providers: SeriousBroker.it  This test is not yet approved or cleared by the [place]  FDA and has been authorized for detection and/or diagnosis of SARS-CoV-2 by FDA under an Emergency Use Authorization (EUA). This EUA will remain in effect (meaning  this test can be used) for the duration of the COVID-19 declaration under Section 564(b)(1) of the Act, 21 U.S.C. section 360bbb-3(b)(1), unless the authorization is terminated or revoked.  Performed at [facility], [address] [place], [place] [address]   Respiratory (~20 pathogens) panel by PCR     Status: Abnormal   Collection Time: 04/08/24  9:47 PM   Specimen: Nasopharyngeal Swab; Respiratory  Result Value Ref Range Status   Adenovirus NOT DETECTED NOT DETECTED Final   Coronavirus 229E NOT DETECTED NOT DETECTED Final    Comment: (NOTE) The Coronavirus on the Respiratory Panel, DOES NOT test for the novel  Coronavirus (2019 nCoV)    Coronavirus HKU1 NOT DETECTED NOT DETECTED Final   Coronavirus NL63 NOT DETECTED NOT DETECTED Final   Coronavirus OC43 NOT DETECTED NOT DETECTED Final   Metapneumovirus NOT DETECTED NOT DETECTED Final   Rhinovirus / Enterovirus DETECTED (A) NOT DETECTED Final   Influenza A NOT DETECTED NOT DETECTED Final   Influenza B NOT DETECTED NOT DETECTED Final   Parainfluenza Virus 1 NOT DETECTED NOT DETECTED Final   Parainfluenza Virus 2 NOT DETECTED NOT DETECTED Final   Parainfluenza Virus 3 NOT DETECTED NOT DETECTED Final   Parainfluenza Virus 4 NOT DETECTED NOT DETECTED Final   Respiratory Syncytial Virus NOT DETECTED NOT DETECTED Final   Bordetella pertussis NOT DETECTED NOT DETECTED Final   Bordetella Parapertussis NOT DETECTED NOT DETECTED Final   Chlamydophila pneumoniae NOT DETECTED NOT DETECTED Final   Mycoplasma pneumoniae NOT DETECTED NOT DETECTED Final    Comment: Performed at [facility], [address] [place], [place] [address]    Radiology Studies: DG CHEST PORT 1 VIEW Result Date: 04/13/2024 CLINICAL DATA:  Shortness of breath. EXAM: PORTABLE CHEST 1 VIEW COMPARISON:  Apr 12, 2024 FINDINGS: The heart size and mediastinal contours are within normal limits. Minimal left lingular subsegmental atelectasis is noted. Right lung  is clear. Left pleural effusion is significantly smaller compared to prior exam. The visualized skeletal structures are unremarkable. IMPRESSION: Left pleural effusion is significantly smaller. Minimal left lingular subsegmental atelectasis. Electronically Signed   By: Irving Mantle.D.  On: 04/13/2024 10:13   US  Abdomen Limited RUQ (LIVER/GB) Result Date: 04/13/2024 CLINICAL DATA:  Abnormal LFTs EXAM: ULTRASOUND ABDOMEN LIMITED RIGHT UPPER QUADRANT COMPARISON:  CT from 04/09/2024 FINDINGS: Gallbladder: Gallbladder is well distended. Multiple gallstones and gallbladder sludge are noted. No wall thickening or pericholecystic fluid is noted. Negative sonographic Murphy's sign is elicited. Common bile duct: Diameter: 0.2 mm. Liver: Diffusely increased in echogenicity consistent with fatty infiltration. No focal mass is noted. Portal vein is patent on color Doppler imaging with normal direction of blood flow towards the liver. Other: None. IMPRESSION: Fatty liver. Cholelithiasis and gallbladder sludge without complicating factors. Electronically Signed   By: Violeta Grey M.D.   On: 04/13/2024 00:00   DG CHEST PORT 1 VIEW Result Date: 04/12/2024 CLINICAL DATA:  Shortness of breath. EXAM: PORTABLE CHEST 1 VIEW COMPARISON:  AP chest 04/11/2024, 04/10/2024, 04/08/2024, 01/27/2006; CT chest 01/29/2016 FINDINGS: Cardiac silhouette is again at the upper limits of normal size. Mediastinal contours are within normal limits. Moderate atherosclerotic calcifications. Compared to yesterday, interval increase in peripheral lateral left hemithorax density extending up to the mid height, combined with increased blunting of the left costophrenic angle. This presumably represents interval worsening of a mild to moderate left pleural effusion. Prominent right and left epicardial fat pads as seen on prior CT. Mild bilateral mid and lower lung interstitial thickening is unchanged. Moderate multilevel degenerative disc changes of the  thoracic spine. Old healed left clavicle midshaft fracture. IMPRESSION: 1. Compared to yesterday, interval increase in peripheral lateral left pleural density/thickening, combined with increased blunting of the left costophrenic angle. This likely represents interval worsening of a mild to moderate left pleural effusion. 2. Mild bilateral mid and lower lung interstitial thickening is unchanged. Electronically Signed   By: Bertina Broccoli M.D.   On: 04/12/2024 09:29   Scheduled Meds:  arformoterol  15 mcg Nebulization BID   budesonide (PULMICORT) nebulizer solution  0.25 mg Nebulization BID   doxycycline   100 mg Oral Q12H   enoxaparin  (LOVENOX ) injection  40 mg Subcutaneous Daily   feeding supplement  237 mL Oral BID BM   guaiFENesin  1,200 mg Oral BID   levothyroxine  100 mcg Oral Q0600   methylPREDNISolone  (SOLU-MEDROL ) injection  60 mg Intravenous Q12H   metoprolol  succinate  12.5 mg Oral Daily   sacubitril-valsartan  1 tablet Oral BID   spironolactone   12.5 mg Oral Daily   Continuous Infusions:   LOS: 4 days   Aura Leeds, DO [facility] Available via Epic secure chat 7am-7pm After these hours, please refer to coverage provider listed on [email or web address] 04/13/2024, 7:54 PM

## 2024-04-13 NOTE — Progress Notes (Signed)
 Occupational Therapy Treatment Patient Details Name: Margaret Douglas MRN: [id number] DOB: 24-Aug-1956 Today's Date: 04/13/2024   History of present illness Pt is a 68 y.o. F who presents 04/08/2024 with worsening shortness of breath. Admitted with Hypoxia in the setting of community-acquired Pneumonia and Suspected Acute Exacerbation of COPD from Rhino/Enterovirus. Echo showing EF 35-40%. Significant PMH: prior breast cancer status postmastectomy and radiation, ongoing tobacco abuse.   OT comments  Pt progressing well towards goals. Pt requiring max encouragement to engage in therapy session, but agreeable to short mobilization. Transfers completed with S for safety. Simulated LB ADLs and toileting with min to CGA for balance. Continue to recommend HHOT to optimize independence levels. Will continue to follow acutely.       If plan is discharge home, recommend the following:  A little help with walking and/or transfers;A little help with bathing/dressing/bathroom;Assistance with cooking/housework;Direct supervision/assist for medications management;Direct supervision/assist for financial management;Assist for transportation;Help with stairs or ramp for entrance;Supervision due to cognitive status   Equipment Recommendations  None recommended by OT    Recommendations for Other Services      Precautions / Restrictions Precautions Precautions: Fall Recall of Precautions/Restrictions: Intact Restrictions Weight Bearing Restrictions Per Provider Order: No       Mobility Bed Mobility Overal bed mobility: Needs Assistance Bed Mobility: Supine to Sit     Supine to sit: Min assist, HOB elevated     General bed mobility comments: Use of handheld assist    Transfers Overall transfer level: Needs assistance Equipment used: Rolling walker (2 wheels) Transfers: Sit to/from Stand, Bed to chair/wheelchair/BSC Sit to Stand: Contact guard assist, From elevated surface     Step pivot  transfers: From elevated surface, Supervision     General transfer comment: Elevated bed, s for safety with management of lines     Balance Overall balance assessment: Needs assistance Sitting-balance support: Feet supported Sitting balance-Leahy Scale: Good     Standing balance support: Bilateral upper extremity supported Standing balance-Leahy Scale: Fair Standing balance comment: Reliant on RW       ADL either performed or assessed with clinical judgement   ADL Overall ADL's : Needs assistance/impaired       Upper Body Dressing : Set up;Sitting Upper Body Dressing Details (indicate cue type and reason): To don sweater Lower Body Dressing: Minimal assistance;Sit to/from stand Lower Body Dressing Details (indicate cue type and reason): Assist to thread legs Toilet Transfer: Ambulation;Rolling walker (2 wheels);Supervision/safety Statistician Details (indicate cue type and reason): Simulated in room Toileting- Clothing Manipulation and Hygiene: Contact guard assist;Sit to/from stand Toileting - Clothing Manipulation Details (indicate cue type and reason): Simulated in room CGA for balance       General ADL Comments: Per pt bathroom at home is shorter distance from bedroom, than here at hospital    Extremity/Trunk Assessment Upper Extremity Assessment Upper Extremity Assessment: Generalized weakness   Lower Extremity Assessment Lower Extremity Assessment: Defer to PT evaluation        Vision   Vision Assessment?: No apparent visual deficits         Communication Communication Communication: No apparent difficulties   Cognition Arousal: Alert Behavior During Therapy: Flat affect Cognition: No apparent impairments       Following commands: Intact        Cueing   Cueing Techniques: Verbal cues        General Comments VSS on 3L [place]    Pertinent Vitals/ Pain       Pain  Assessment Pain Assessment: No/denies pain   Frequency  Min 2X/week         Progress Toward Goals  OT Goals(current goals can now be found in the care plan section)  Progress towards OT goals: Progressing toward goals  Acute Rehab OT Goals Patient Stated Goal: To go home OT Goal Formulation: With patient Time For Goal Achievement: 04/26/24 Potential to Achieve Goals: Good ADL Goals Pt Will Perform Upper Body Dressing: with modified independence;sitting Pt Will Perform Lower Body Dressing: with modified independence;sitting/lateral leans;sit to/from stand Pt Will Transfer to Toilet: with modified independence;ambulating;regular height toilet Pt Will Perform Tub/Shower Transfer: Shower transfer;with modified independence;ambulating Additional ADL Goal #1: pt will tolerate standing OOB activity x10 min in order to improve activity tolerance for ADLs  Plan         AM-PAC OT "6 Clicks" Daily Activity     Outcome Measure   Help from another person eating meals?: A Little Help from another person taking care of personal grooming?: A Little Help from another person toileting, which includes using toliet, bedpan, or urinal?: A Little Help from another person bathing (including washing, rinsing, drying)?: A Lot Help from another person to put on and taking off regular upper body clothing?: A Little Help from another person to put on and taking off regular lower body clothing?: A Little 6 Click Score: 17    End of Session Equipment Utilized During Treatment: Gait belt;Rolling walker (2 wheels);Oxygen  OT Visit Diagnosis: Unsteadiness on feet (R26.81);Other abnormalities of gait and mobility (R26.89);Muscle weakness (generalized) (M62.81)   Activity Tolerance Patient tolerated treatment well   Patient Left in chair;with call bell/phone within reach   Nurse Communication Mobility status        Time: 4132-4401 OT Time Calculation (min): 15 min  Charges: OT General Charges $OT Visit: 1 Visit OT Treatments $Self Care/Home Management : 8-22  mins  Delmer Ferraris, OT  Acute Rehabilitation Services Office [phone] Secure chat preferred   Mickael Alamo 04/13/2024, 1:45 PM

## 2024-04-13 NOTE — Progress Notes (Signed)
 5/6 IMM Letter given to RN assigned to patient to give to patient at bedside.

## 2024-04-13 NOTE — Progress Notes (Addendum)
   Patient Name: Margaret Douglas Date of Encounter: 04/13/2024 [facility] Cardiologist: Alexandria Angel, MD   Interval Summary  .    Patient resting in bed, still on 3 L oxygen via [place] States her shortness of breath has not improved or worsened Denies any chest pain  Vital Signs .    Vitals:   04/13/24 0600 04/13/24 0740 04/13/24 0743 04/13/24 0747  BP: 130/82     Pulse: 60     Resp: 17     Temp: 97.6 F (36.4 C)     TempSrc: Oral     SpO2: 94% 93% 93% 93%  Weight:        Intake/Output Summary (Last 24 hours) at 04/13/2024 1248 Last data filed at 04/12/2024 2100 Gross per 24 hour  Intake 25 ml  Output 200 ml  Net -175 ml      04/08/2024    9:39 PM 03/17/2020    8:50 AM 03/06/2020    9:21 AM  Last 3 Weights  Weight (lbs) 230 lb 211 lb 4.8 oz 210 lb 1.6 oz  Weight (kg) 104.327 kg 95.845 kg 95.3 kg      Telemetry/ECG    Sinus rhythm, HR 70s - Personally Reviewed  Physical Exam .   GEN: No acute distress, resting, on 3 L oxygen via [place]: No JVD Cardiac: RRR, no murmurs, rubs, or gallops.  Respiratory: diminished breath sounds with wheezing bilaterally. GI: Soft, nontender, non-distended  MS: No edema  Assessment & Plan .     Acute on chronic HFrEF Echo showed: EF 35-40%, global hypokinesis, septal-lateral dyssynchrony suggestive of IVCD, G1DD, normal RV function  Reduced LV function thought to be non-ischemic from ETOH use, prior chemotherapy Euvolemic on exam, symptoms likely due to her COPD BNP 48 > 122 patient was given IV Lasix 40 mg. Follow up BNP 145 Creatinine raised from 0.84 to 1.03 Mild increase in BNP likely relative to increased creatinine level after receiving Lasix as opposed to true indicator of volume status  Most recent BP 130/82, HR 60  Plan for outpatient coronary CTA to exclude CAD -- will be arranged as outpatient Continue Toprol  12.5 mg daily Continue Entresto 24-26 mg BID  Continue spironolactone  12.5 mg daily   Per  primary Acute respiratory failure with hypoxia Hypothyroidism History of breast cancer COPD, PNA Abnormal LFTs  For questions or updates, please contact [facility] Please consult [email or web address] for contact info under        Signed, Antoinette Batman, MD   I have personally seen and examined this patient. I agree with the assessment and plan as outlined above.  No change in baseline dyspnea today. I do not think she is volume overloaded on exam.  Continue Toprol  and Entresto/aldactone .  We will plan an outpatient coronary CTA  Antoinette Batman, MD, [facility] 04/13/2024 12:48 PM

## 2024-04-14 DIAGNOSIS — J9601 Acute respiratory failure with hypoxia: Secondary | ICD-10-CM | POA: Diagnosis not present

## 2024-04-14 DIAGNOSIS — J206 Acute bronchitis due to rhinovirus: Secondary | ICD-10-CM | POA: Diagnosis not present

## 2024-04-14 DIAGNOSIS — I5022 Chronic systolic (congestive) heart failure: Secondary | ICD-10-CM

## 2024-04-14 LAB — URINALYSIS, ROUTINE W REFLEX MICROSCOPIC
Bilirubin Urine: NEGATIVE
Glucose, UA: NEGATIVE mg/dL
Ketones, ur: NEGATIVE mg/dL
Leukocytes,Ua: NEGATIVE
Nitrite: NEGATIVE
Protein, ur: NEGATIVE mg/dL
Specific Gravity, Urine: 1.025 (ref 1.005–1.030)
pH: 6 (ref 5.0–8.0)

## 2024-04-14 LAB — COMPREHENSIVE METABOLIC PANEL WITH GFR
ALT: 76 U/L — ABNORMAL HIGH (ref 0–44)
AST: 99 U/L — ABNORMAL HIGH (ref 15–41)
Albumin: 3.4 g/dL — ABNORMAL LOW (ref 3.5–5.0)
Alkaline Phosphatase: 61 U/L (ref 38–126)
Anion gap: 13 (ref 5–15)
BUN: 30 mg/dL — ABNORMAL HIGH (ref 8–23)
CO2: 30 mmol/L (ref 22–32)
Calcium: 9.3 mg/dL (ref 8.9–10.3)
Chloride: 96 mmol/L — ABNORMAL LOW (ref 98–111)
Creatinine, Ser: 0.91 mg/dL (ref 0.44–1.00)
GFR, Estimated: >60 mL/min (ref >60)
Glucose, Bld: 108 mg/dL — ABNORMAL HIGH (ref 70–99)
Potassium: 3.5 mmol/L (ref 3.5–5.1)
Sodium: 139 mmol/L (ref 135–145)
Total Bilirubin: 1.2 mg/dL (ref 0.0–1.2)
Total Protein: 5.8 g/dL — ABNORMAL LOW (ref 6.5–8.1)

## 2024-04-14 LAB — CBC WITH DIFFERENTIAL/PLATELET
Abs Immature Granulocytes: 0.04 10*3/uL (ref 0.00–0.07)
Basophils Absolute: 0 10*3/uL (ref 0.0–0.1)
Basophils Relative: 0 %
Eosinophils Absolute: 0 10*3/uL (ref 0.0–0.5)
Eosinophils Relative: 0 %
HCT: 49.3 % — ABNORMAL HIGH (ref 36.0–46.0)
Hemoglobin: 17.4 g/dL — ABNORMAL HIGH (ref 12.0–15.0)
Immature Granulocytes: 1 %
Lymphocytes Relative: 14 %
Lymphs Abs: 0.9 10*3/uL (ref 0.7–4.0)
MCH: 36.2 pg — ABNORMAL HIGH (ref 26.0–34.0)
MCHC: 35.3 g/dL (ref 30.0–36.0)
MCV: 102.5 fL — ABNORMAL HIGH (ref 80.0–100.0)
Monocytes Absolute: 0.6 10*3/uL (ref 0.1–1.0)
Monocytes Relative: 9 %
Neutro Abs: 5.2 10*3/uL (ref 1.7–7.7)
Neutrophils Relative %: 76 %
Platelets: 185 10*3/uL (ref 150–400)
RBC: 4.81 MIL/uL (ref 3.87–5.11)
RDW: 15.9 % — ABNORMAL HIGH (ref 11.5–15.5)
WBC: 6.7 10*3/uL (ref 4.0–10.5)
nRBC: 0 % (ref 0.0–0.2)

## 2024-04-14 LAB — URINALYSIS, MICROSCOPIC (REFLEX)

## 2024-04-14 LAB — MAGNESIUM: Magnesium: 2.1 mg/dL (ref 1.7–2.4)

## 2024-04-14 LAB — VITAMIN B12: Vitamin B-12: 884 pg/mL (ref 180–914)

## 2024-04-14 LAB — PHOSPHORUS: Phosphorus: 2.8 mg/dL (ref 2.5–4.6)

## 2024-04-14 LAB — FOLATE: Folate: 9 ng/mL (ref >5.9)

## 2024-04-14 MED ORDER — PREDNISONE 20 MG PO TABS
60.0000 mg | ORAL_TABLET | Freq: Every day | ORAL | Status: DC
  Administered 2024-04-15: 60 mg via ORAL
  Filled 2024-04-14: qty 3

## 2024-04-14 MED ORDER — FLUTICASONE FUROATE-VILANTEROL 200-25 MCG/ACT IN AEPB
1.0000 | INHALATION_SPRAY | Freq: Every day | RESPIRATORY_TRACT | Status: DC
  Administered 2024-04-15: 1 via RESPIRATORY_TRACT
  Filled 2024-04-14: qty 28

## 2024-04-14 MED ORDER — POTASSIUM CHLORIDE CRYS ER 20 MEQ PO TBCR
40.0000 meq | EXTENDED_RELEASE_TABLET | Freq: Once | ORAL | Status: AC
  Administered 2024-04-14: 40 meq via ORAL
  Filled 2024-04-14: qty 2

## 2024-04-14 NOTE — Progress Notes (Signed)
 [facility] PROGRESS NOTE   Margaret Douglas QMV:[id number] DOB: [birth year] DOA: 04/08/2024  PCP: Monique Ano, MD  Brief History: 68 y.o. female with history of prior breast cancer status postmastectomy and radiation, ongoing tobacco abuse presented to the ER because of worsening shortness of breath.  Workup revealed a Rhino Enterovirus CAP and suspected COPD exacerbation.  She was hospitalized for further management  Consultants: Cardiology  Procedures: Echocardiogram    Subjective/Interval History: Patient feels poorly.  Continues to have shortness of breath.  Denies any chest pain nausea or vomiting.    Assessment/Plan:  Acute Respiratory Failure with Hypoxia in the setting of community-acquired Pneumonia and Suspected Acute Exacerbation of COPD from Rhino/Enterovirus Was initially requiring 5 L of oxygen by nasal cannula.  Now down to 3 L. Desaturated when taken off of oxygen.  Will likely need home oxygen. No wheezing heard on examination this morning. Noted to be on Solu-Medrol , budesonide nebulized, Brovana nebulized.   Continue to wean down steroids. Continue to mobilize. Will need outpatient follow-up with pulmonology. Smoking cessation counseling provided.     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 Cardiology was consulted. Patient was given 1 dose of furosemide. Not thought to be overtly fluid overloaded currently. Patient started on GDMT including Entresto spironolactone .  Noted to be on metoprolol . Occasional low blood pressures noted. Renal function is stable this morning.   Arrhythmias Noted on Tele. Consist w/ LBBB w/ return to normal QRS intervals. Cardiology following   Transaminitis Probably due to acute illness.  Improving LFTs noted today.  Hepatitis panel was unremarkable.  Right upper quadrant ultrasound showed fatty liver.  Incidental cholelithiasis without any complicating features were  noted.  Bilirubin is normal.  Alkaline phosphatase noted to be normal.     Elevated creatinine Mildly elevated creatinine noted.  Clinical significance unclear.  Normal this morning.     Prior history of Breast Cancer S/p Lumpectomy and Radiation.   Hypothyroidism TSH 23.736 and Free T4 was <0.25. Reinitiated Levothyroxine at 100 mcg po Daily  Thyroid  function test will need to be rechecked in a few weeks.   Erthrocytosis and Macrocytosis Most likely due to tobacco abuse.  B12 and folic acid levels normal.  Obesity Estimated body mass index is 33 kg/m as calculated from the following:   Height as of 03/06/20: 5\' 10"  (1.778 m).   Weight as of this encounter: 104.3 kg.   DVT Prophylaxis: Lovenox  Code Status: Full code Family Communication: Discussed with patient Disposition Plan: Home with home health when medically ready      Medications: Scheduled:  arformoterol  15 mcg Nebulization BID   budesonide (PULMICORT) nebulizer solution  0.25 mg Nebulization BID   doxycycline   100 mg Oral Q12H   enoxaparin  (LOVENOX ) injection  40 mg Subcutaneous Daily   feeding supplement  237 mL Oral BID BM   guaiFENesin  1,200 mg Oral BID   levothyroxine  100 mcg Oral Q0600   methylPREDNISolone  (SOLU-MEDROL ) injection  60 mg Intravenous Q24H   metoprolol  succinate  12.5 mg Oral Daily   potassium chloride  40 mEq Oral Once   sacubitril-valsartan  1 tablet Oral BID   spironolactone   12.5 mg Oral Daily   Continuous: MWU:[id number]$ , melatonin  Antibiotics: Anti-infectives (From admission, onward)    Start     Dose/Rate Route Frequency Ordered Stop   04/11/24 1000  doxycycline  (VIBRA -TABS) tablet 100 mg        100 mg Oral Every  12 hours 04/11/24 0733     04/09/24 0645  doxycycline  (VIBRAMYCIN ) 100 mg in sodium chloride  0.9 % 250 mL IVPB  Status:  Discontinued        100 mg 125 mL/hr over 120 Minutes Intravenous 2 times daily 04/09/24 0605 04/11/24 0733   04/09/24 0600   cefTRIAXone  (ROCEPHIN ) 2 g in sodium chloride  0.9 % 100 mL IVPB        2 g 200 mL/hr over 30 Minutes Intravenous Daily 04/09/24 0517 04/13/24 0854   04/09/24 0600  azithromycin  (ZITHROMAX ) 500 mg in sodium chloride  0.9 % 250 mL IVPB  Status:  Discontinued        500 mg 250 mL/hr over 60 Minutes Intravenous Daily 04/09/24 0517 04/09/24 0605       Objective:  Vital Signs  Vitals:   04/13/24 1929 04/13/24 1949 04/14/24 0447 04/14/24 0807  BP:  97/67 116/68 122/66  Pulse:  71 74 66  Resp:  17 18 18   Temp:  98.8 F (37.1 C) (!) 97.4 F (36.3 C) (!) 97.5 F (36.4 C)  TempSrc:  Oral Oral Oral  SpO2: 93% 90% 91% 92%  Weight:        Intake/Output Summary (Last 24 hours) at 04/14/2024 1009 Last data filed at 04/13/2024 2100 Gross per 24 hour  Intake 225 ml  Output --  Net 225 ml   Filed Weights   04/08/24 2139  Weight: 104.3 kg    General appearance: Awake alert.  In no distress Resp: Normal effort at rest.  Coarse breath sounds.  No wheezing or rhonchi appreciated.  No definite crackles. Cardio: S1-S2 is normal regular.  No S3-S4.  No rubs murmurs or bruit GI: Abdomen is soft.  Nontender nondistended.  Bowel sounds are present normal.  No masses organomegaly Extremities: No edema.  Physical deconditioning is noted Neurologic: Alert and oriented x3.  No focal neurological deficits.    Lab Results:  Data Reviewed: I have personally reviewed following labs and reports of the imaging studies  CBC: Recent Labs  Lab 04/10/24 1101 04/11/24 0541 04/12/24 0651 04/13/24 0644 04/14/24 0537  WBC 8.0 7.6 6.9 7.4 6.7  NEUTROABS 6.6 6.2 6.1 6.6 5.2  HGB 16.6* 15.6* 17.0* 19.7* 17.4*  HCT 48.3* 45.4 48.4* 55.5* 49.3*  MCV 107.3* 106.3* 105.0* 103.9* 102.5*  PLT 175 167 178 209 185    Basic Metabolic Panel: Recent Labs  Lab 04/10/24 1537 04/10/24 2112 04/11/24 0541 04/12/24 0651 04/13/24 0644 04/14/24 0537  NA  --  136 137 136 137 139  K  --  4.2 4.2 4.5 3.8 3.5  CL   --  96* 99 98 92* 96*  CO2  --  29 27 26 29 30   GLUCOSE  --  142* 91 123* 143* 108*  BUN  --  27* 27* 22 24* 30*  CREATININE  --  0.89 0.82 0.84 1.03* 0.91  CALCIUM  --  9.4 9.1 9.4 9.2 9.3  MG  --  2.1 2.1 2.0 2.2 2.1  PHOS 2.5  --  2.2* 1.9* 3.6 2.8    GFR: CrCl cannot be calculated (Unknown ideal weight.).  Liver Function Tests: Recent Labs  Lab 04/10/24 2112 04/11/24 0541 04/12/24 0651 04/13/24 0644 04/14/24 0537  AST 105* 100* 131* 174* 99*  ALT 48* 46* 65* 98* 76*  ALKPHOS 66 63 67 86 61  BILITOT 1.0 1.2 0.9 1.4* 1.2  PROT 6.0* 5.9* 6.0* 7.5 5.8*  ALBUMIN 3.7 3.5 3.6 4.4 3.4*  Lipid Profile: Recent Labs    04/12/24 1005  CHOL 150  HDL 75  LDLCALC 69  TRIG 30  CHOLHDL 2.0    Anemia Panel: Recent Labs    04/14/24 0537  VITAMINB12 884  FOLATE 9.0    Recent Results (from the past 240 hours)  Resp panel by RT-PCR (RSV, Flu A&B, Covid) Anterior Nasal Swab     Status: None   Collection Time: 04/08/24  9:47 PM   Specimen: Anterior Nasal Swab  Result Value Ref Range Status   SARS Coronavirus 2 by RT PCR NEGATIVE NEGATIVE Final   Influenza A by PCR NEGATIVE NEGATIVE Final   Influenza B by PCR NEGATIVE NEGATIVE Final    Comment: (NOTE) The Xpert Xpress SARS-CoV-2/FLU/RSV plus assay is intended as an aid in the diagnosis of influenza from Nasopharyngeal swab specimens and should not be used as a sole basis for treatment. Nasal washings and aspirates are unacceptable for Xpert Xpress SARS-CoV-2/FLU/RSV testing.  Fact Sheet for Patients: BloggerCourse.com  Fact Sheet for Healthcare Providers: SeriousBroker.it  This test is not yet approved or cleared by the [place]  FDA and has been authorized for detection and/or diagnosis of SARS-CoV-2 by FDA under an Emergency Use Authorization (EUA). This EUA will remain in effect (meaning this test can be used) for the duration of the COVID-19 declaration  under Section 564(b)(1) of the Act, 21 U.S.C. section 360bbb-3(b)(1), unless the authorization is terminated or revoked.     Resp Syncytial Virus by PCR NEGATIVE NEGATIVE Final    Comment: (NOTE) Fact Sheet for Patients: BloggerCourse.com  Fact Sheet for Healthcare Providers: SeriousBroker.it  This test is not yet approved or cleared by the [place]  FDA and has been authorized for detection and/or diagnosis of SARS-CoV-2 by FDA under an Emergency Use Authorization (EUA). This EUA will remain in effect (meaning this test can be used) for the duration of the COVID-19 declaration under Section 564(b)(1) of the Act, 21 U.S.C. section 360bbb-3(b)(1), unless the authorization is terminated or revoked.  Performed at [facility], [address], [place], [place] [address]   Respiratory (~20 pathogens) panel by PCR     Status: Abnormal   Collection Time: 04/08/24  9:47 PM   Specimen: Nasopharyngeal Swab; Respiratory  Result Value Ref Range Status   Adenovirus NOT DETECTED NOT DETECTED Final   Coronavirus 229E NOT DETECTED NOT DETECTED Final    Comment: (NOTE) The Coronavirus on the Respiratory Panel, DOES NOT test for the novel  Coronavirus (2019 nCoV)    Coronavirus HKU1 NOT DETECTED NOT DETECTED Final   Coronavirus NL63 NOT DETECTED NOT DETECTED Final   Coronavirus OC43 NOT DETECTED NOT DETECTED Final   Metapneumovirus NOT DETECTED NOT DETECTED Final   Rhinovirus / Enterovirus DETECTED (A) NOT DETECTED Final   Influenza A NOT DETECTED NOT DETECTED Final   Influenza B NOT DETECTED NOT DETECTED Final   Parainfluenza Virus 1 NOT DETECTED NOT DETECTED Final   Parainfluenza Virus 2 NOT DETECTED NOT DETECTED Final   Parainfluenza Virus 3 NOT DETECTED NOT DETECTED Final   Parainfluenza Virus 4 NOT DETECTED NOT DETECTED Final   Respiratory Syncytial Virus NOT DETECTED NOT DETECTED Final   Bordetella pertussis NOT DETECTED NOT  DETECTED Final   Bordetella Parapertussis NOT DETECTED NOT DETECTED Final   Chlamydophila pneumoniae NOT DETECTED NOT DETECTED Final   Mycoplasma pneumoniae NOT DETECTED NOT DETECTED Final    Comment: Performed at [facility], [address], [place], [place] [address]  Radiology Studies: DG CHEST PORT 1 VIEW Result Date: 04/13/2024 CLINICAL DATA:  Shortness of breath. EXAM: PORTABLE CHEST 1 VIEW COMPARISON:  Apr 12, 2024 FINDINGS: The heart size and mediastinal contours are within normal limits. Minimal left lingular subsegmental atelectasis is noted. Right lung is clear. Left pleural effusion is significantly smaller compared to prior exam. The visualized skeletal structures are unremarkable. IMPRESSION: Left pleural effusion is significantly smaller. Minimal left lingular subsegmental atelectasis. Electronically Signed   By: Rosalene Colon M.D.   On: 04/13/2024 10:13   US  Abdomen Limited RUQ (LIVER/GB) Result Date: 04/13/2024 CLINICAL DATA:  Abnormal LFTs EXAM: ULTRASOUND ABDOMEN LIMITED RIGHT UPPER QUADRANT COMPARISON:  CT from 04/09/2024 FINDINGS: Gallbladder: Gallbladder is well distended. Multiple gallstones and gallbladder sludge are noted. No wall thickening or pericholecystic fluid is noted. Negative sonographic Murphy's sign is elicited. Common bile duct: Diameter: 0.2 mm. Liver: Diffusely increased in echogenicity consistent with fatty infiltration. No focal mass is noted. Portal vein is patent on color Doppler imaging with normal direction of blood flow towards the liver. Other: None. IMPRESSION: Fatty liver. Cholelithiasis and gallbladder sludge without complicating factors. Electronically Signed   By: Violeta Grey M.D.   On: 04/13/2024 00:00       LOS: 5 days   Patryck Kilgore [employer] on [email or web address]  04/14/2024, 10:09 AM

## 2024-04-14 NOTE — TOC Initial Note (Signed)
 Transition of Care [facility]) - Initial/Assessment Note    Patient Details  Name: Margaret Douglas MRN: [id number] Date of Birth: 23-Apr-1956  Transition of Care [facility] CM/SW Contact:    Omie Bickers, RN Phone Number: 04/14/2024, 11:08 AM  Clinical Narrative:                  Margaret Douglas w patient at bedside.  She states that she would like to go home at DC is interested in home health services and deferred provider to CM.  Referrals accepted by [facility] services and [employer] for home oxygen.  Patient states that her niece and nephew live with her and they will be able to provide transportation home.  She has a RW at home  Expected Discharge Plan: Home w Home Health Services Barriers to Discharge: Continued Medical Work up   Patient Goals and CMS Choice Patient states their goals for this hospitalization and ongoing recovery are:: to go home CMS Medicare.gov Compare Post Acute Care list provided to:: Patient Choice offered to / list presented to : Patient      Expected Discharge Plan and Services   Discharge Planning Services: CM Consult Post Acute Care Choice: Home Health, Durable Medical Equipment Living arrangements for the past 2 months: Single Family Home                 DME Arranged: Oxygen DME Agency: [employer] Date DME Agency Contacted: 04/14/24 Time DME Agency Contacted: 1106 Representative spoke with at DME Agency: Zula Hitch HH Arranged: PT, OT HH Agency: [facility] Date [facility] Agency Contacted: 04/14/24 Time HH Agency Contacted: 1106 Representative spoke with at [facility] Agency: Amy  Prior Living Arrangements/Services Living arrangements for the past 2 months: Single Family Home Lives with:: Relatives   Do you feel safe going back to the place where you live?: Yes          Current home services: DME    Activities of Daily Living   ADL Screening (condition at time of admission) Independently performs ADLs?: Yes (appropriate for developmental  age) Is the patient deaf or have difficulty hearing?: Yes Does the patient have difficulty seeing, even when wearing glasses/contacts?: No Does the patient have difficulty concentrating, remembering, or making decisions?: No  Permission Sought/Granted                  Emotional Assessment              Admission diagnosis:  Acute bronchitis [J20.9] Acute hypoxic respiratory failure (HCC) [J96.01] Acute respiratory failure with hypoxia (HCC) [J96.01] Patient Active Problem List   Diagnosis Date Noted   Acute bronchitis 04/09/2024   Acute respiratory failure with hypoxia (HCC) 04/09/2024   Polycythemia 04/09/2024   CKD (chronic kidney disease) stage 3, GFR 30-59 ml/min (HCC) 04/09/2024   Macrocytic anemia 04/09/2024   Pneumonia 04/09/2024   Goals of care, counseling/discussion 02/26/2020   Ductal carcinoma in situ (DCIS) of left breast 02/26/2020   Acquired hypothyroidism 02/22/2020   Borderline diabetes mellitus 02/22/2020   COPD (chronic obstructive pulmonary disease) (HCC) 02/22/2020   History of breast cancer 02/22/2020   Non morbid obesity due to excess calories 02/22/2020   Pure hypercholesterolemia 02/22/2020   Tobacco dependence 02/22/2020   Essential hypertension 06/10/2018   PCP:  Monique Ano, MD Pharmacy:   [id number] - [place], [place] - [address] AT [facility] [address] [facility] [address] [place] [address] Phone:  [phone] Fax: [phone]  [id number] - [place], [place] - [address] [place] [address] Phone: [phone] Fax: [phone]     Social Drivers of Health (SDOH) Social History: SDOH Screenings   Food Insecurity: No Food Insecurity (04/09/2024)  Housing: Low Risk  (04/09/2024)  Transportation Needs: No Transportation Needs (04/09/2024)  Utilities: Not At Risk (04/09/2024)  Social Connections: Socially Isolated (04/09/2024)  Tobacco Use: High Risk  (04/09/2024)   SDOH Interventions:     Readmission Risk Interventions     No data to display

## 2024-04-14 NOTE — Progress Notes (Signed)
 Physical Therapy Treatment Patient Details Name: Margaret Douglas MRN: [id number] DOB: Apr 23, 1956 Today's Date: 04/14/2024   History of Present Illness Pt is a 68 y.o. F who presents 04/08/2024 with worsening shortness of breath. Admitted with Hypoxia in the setting of community-acquired Pneumonia and Suspected Acute Exacerbation of COPD from Rhino/Enterovirus. Echo showing EF 35-40%. Significant PMH: prior breast cancer status postmastectomy and radiation, ongoing tobacco abuse.    PT Comments  Patient resting in bed on arrival and requesting to mobilize to bathroom Min assist required for supine>sit and assist for O2 line management. Pt required EOB elevated significant amount and CGA for sit<>stand and ambulation to bathroom, cues for proximity to RW throughout. Pt able to shift on toilet for pericare with single UE support, CGA for safety. Pt unable to rise from low toilet with max support, cues for hand placement and +2 mod assist required to lift to RW, pt able to stabilize balance in standing with RW, CGA for safety to return to bed and min assist to return to supine. Discussed concerns with pt returning home and emphasized needs for 24/7 assist/support. Will continue to progress in acute setting as able.     If plan is discharge home, recommend the following: A little help with walking and/or transfers;A little help with bathing/dressing/bathroom;Assistance with cooking/housework;Assist for transportation;Help with stairs or ramp for entrance   Can travel by private vehicle        Equipment Recommendations  None recommended by PT    Recommendations for Other Services       Precautions / Restrictions Precautions Precautions: Fall Recall of Precautions/Restrictions: Intact Restrictions Weight Bearing Restrictions Per Provider Order: No     Mobility  Bed Mobility Overal bed mobility: Needs Assistance Bed Mobility: Supine to Sit, Sit to Supine     Supine to sit: Min assist, HOB  elevated Sit to supine: Min assist, Used rails   General bed mobility comments: min assist to fully raise trunk, min assist to bring Le's onto bed    Transfers Overall transfer level: Needs assistance Equipment used: Rolling walker (2 wheels) Transfers: Sit to/from Stand, Bed to chair/wheelchair/BSC Sit to Stand: Contact guard assist, From elevated surface   Step pivot transfers: From elevated surface, Supervision       General transfer comment: Elevated bed, sup for safety with management of lines    Ambulation/Gait Ambulation/Gait assistance: Contact guard assist Gait Distance (Feet): 12 Feet (2x12) Assistive device: Rolling walker (2 wheels) Gait Pattern/deviations: Step-through pattern, Decreased stride length, Trunk flexed, Shuffle Gait velocity: decr     General Gait Details: Ambulating to and from bathroom with CGA, cues to maintain proximity to RW.   Stairs             Wheelchair Mobility     Tilt Bed    Modified Rankin (Stroke Patients Only)       Balance Overall balance assessment: Needs assistance Sitting-balance support: Feet supported Sitting balance-Leahy Scale: Good     Standing balance support: Bilateral upper extremity supported Standing balance-Leahy Scale: Fair Standing balance comment: Reliant on RW                            Communication Communication Communication: No apparent difficulties  Cognition Arousal: Alert Behavior During Therapy: Flat affect   PT - Cognitive impairments: No apparent impairments  PT - Cognition Comments: Flat affect, likely at baseline. slow to respond. Following commands: Intact      Cueing Cueing Techniques: Verbal cues  Exercises      General Comments        Pertinent Vitals/Pain Pain Assessment Pain Assessment: No/denies pain    Home Living                          Prior Function            PT Goals (current goals can now  be found in the care plan section) Acute Rehab PT Goals PT Goal Formulation: With patient Time For Goal Achievement: 04/26/24 Potential to Achieve Goals: Good Progress towards PT goals: Progressing toward goals    Frequency    Min 2X/week      PT Plan      Co-evaluation              AM-PAC PT "6 Clicks" Mobility   Outcome Measure  Help needed turning from your back to your side while in a flat bed without using bedrails?: A Little Help needed moving from lying on your back to sitting on the side of a flat bed without using bedrails?: A Little Help needed moving to and from a bed to a chair (including a wheelchair)?: A Little Help needed standing up from a chair using your arms (e.g., wheelchair or bedside chair)?: A Little Help needed to walk in hospital room?: A Little Help needed climbing 3-5 steps with a railing? : A Lot 6 Click Score: 17    End of Session Equipment Utilized During Treatment: Gait belt;Oxygen Activity Tolerance: Patient tolerated treatment well Patient left: in bed;with call bell/phone within reach;with bed alarm set Nurse Communication: Mobility status PT Visit Diagnosis: Unsteadiness on feet (R26.81);History of falling (Z91.81);Difficulty in walking, not elsewhere classified (R26.2)     Time: 1610-9604 PT Time Calculation (min) (ACUTE ONLY): 29 min  Charges:    $Gait Training: 8-22 mins $Therapeutic Activity: 8-22 mins PT General Charges $$ ACUTE PT VISIT: 1 Visit                     Tish Forge, DPT Acute Rehabilitation Services Office [phone]  04/14/24 5:15 PM

## 2024-04-14 NOTE — Plan of Care (Signed)
 Patient stable, still on  2-3L, home o2 tank at bedside. Problem: Education: Goal: Knowledge of General Education information will improve Description: Including pain rating scale, medication(s)/side effects and non-pharmacologic comfort measures Outcome: Progressing   Problem: Health Behavior/Discharge Planning: Goal: Ability to manage health-related needs will improve Outcome: Progressing   Problem: Clinical Measurements: Goal: Ability to maintain clinical measurements within normal limits will improve Outcome: Progressing Goal: Will remain free from infection Outcome: Progressing Goal: Diagnostic test results will improve Outcome: Progressing Goal: Respiratory complications will improve Outcome: Progressing Goal: Cardiovascular complication will be avoided Outcome: Progressing   Problem: Activity: Goal: Risk for activity intolerance will decrease Outcome: Progressing   Problem: Nutrition: Goal: Adequate nutrition will be maintained Outcome: Progressing   Problem: Coping: Goal: Level of anxiety will decrease Outcome: Progressing   Problem: Elimination: Goal: Will not experience complications related to bowel motility Outcome: Progressing Goal: Will not experience complications related to urinary retention Outcome: Progressing   Problem: Pain Managment: Goal: General experience of comfort will improve and/or be controlled Outcome: Progressing   Problem: Safety: Goal: Ability to remain free from injury will improve Outcome: Progressing   Problem: Skin Integrity: Goal: Risk for impaired skin integrity will decrease Outcome: Progressing   Problem: Activity: Goal: Ability to tolerate increased activity will improve Outcome: Progressing   Problem: Clinical Measurements: Goal: Ability to maintain a body temperature in the normal range will improve Outcome: Progressing   Problem: Respiratory: Goal: Ability to maintain adequate ventilation will  improve Outcome: Progressing Goal: Ability to maintain a clear airway will improve Outcome: Progressing

## 2024-04-15 ENCOUNTER — Other Ambulatory Visit (HOSPITAL_COMMUNITY): Payer: Self-pay

## 2024-04-15 DIAGNOSIS — J9601 Acute respiratory failure with hypoxia: Secondary | ICD-10-CM | POA: Diagnosis not present

## 2024-04-15 LAB — COMPREHENSIVE METABOLIC PANEL WITH GFR
ALT: 72 U/L — ABNORMAL HIGH (ref 0–44)
AST: 88 U/L — ABNORMAL HIGH (ref 15–41)
Albumin: 3.4 g/dL — ABNORMAL LOW (ref 3.5–5.0)
Alkaline Phosphatase: 57 U/L (ref 38–126)
Anion gap: 11 (ref 5–15)
BUN: 35 mg/dL — ABNORMAL HIGH (ref 8–23)
CO2: 29 mmol/L (ref 22–32)
Calcium: 9 mg/dL (ref 8.9–10.3)
Chloride: 99 mmol/L (ref 98–111)
Creatinine, Ser: 0.93 mg/dL (ref 0.44–1.00)
GFR, Estimated: >60 mL/min (ref >60)
Glucose, Bld: 111 mg/dL — ABNORMAL HIGH (ref 70–99)
Potassium: 4.2 mmol/L (ref 3.5–5.1)
Sodium: 139 mmol/L (ref 135–145)
Total Bilirubin: 1.2 mg/dL (ref 0.0–1.2)
Total Protein: 6 g/dL — ABNORMAL LOW (ref 6.5–8.1)

## 2024-04-15 MED ORDER — SPIRONOLACTONE 25 MG PO TABS
12.5000 mg | ORAL_TABLET | Freq: Every day | ORAL | 0 refills | Status: AC
  Filled 2024-04-15: qty 45, 90d supply, fill #0

## 2024-04-15 MED ORDER — PREDNISONE 20 MG PO TABS
ORAL_TABLET | ORAL | 0 refills | Status: AC
  Filled 2024-04-15: qty 18, 9d supply, fill #0

## 2024-04-15 MED ORDER — IPRATROPIUM-ALBUTEROL 0.5-2.5 (3) MG/3ML IN SOLN
3.0000 mL | RESPIRATORY_TRACT | 0 refills | Status: AC | PRN
  Filled 2024-04-15: qty 360, 10d supply, fill #0

## 2024-04-15 MED ORDER — GUAIFENESIN ER 600 MG PO TB12
1200.0000 mg | ORAL_TABLET | Freq: Two times a day (BID) | ORAL | 0 refills | Status: DC | PRN
  Filled 2024-04-15: qty 30, 8d supply, fill #0

## 2024-04-15 MED ORDER — SACUBITRIL-VALSARTAN 24-26 MG PO TABS
1.0000 | ORAL_TABLET | Freq: Two times a day (BID) | ORAL | 0 refills | Status: AC
  Filled 2024-04-15: qty 60, 30d supply, fill #0

## 2024-04-15 MED ORDER — FLUTICASONE FUROATE-VILANTEROL 200-25 MCG/ACT IN AEPB
1.0000 | INHALATION_SPRAY | Freq: Every day | RESPIRATORY_TRACT | 0 refills | Status: AC
  Filled 2024-04-15: qty 60, 30d supply, fill #0

## 2024-04-15 MED ORDER — METOPROLOL SUCCINATE ER 25 MG PO TB24
12.5000 mg | ORAL_TABLET | Freq: Every day | ORAL | 0 refills | Status: AC
Start: 2024-04-15 — End: ?
  Filled 2024-04-15: qty 30, 60d supply, fill #0

## 2024-04-15 MED ORDER — LEVOTHYROXINE SODIUM 100 MCG PO TABS
100.0000 ug | ORAL_TABLET | Freq: Every day | ORAL | 0 refills | Status: AC
  Filled 2024-04-15: qty 90, 90d supply, fill #0

## 2024-04-15 NOTE — Progress Notes (Signed)
 TOC meds, oxygen tank, neb machine delivered to room. Patient being transported home via private vehicle.

## 2024-04-15 NOTE — Discharge Summary (Addendum)
 [facility]  Physician Discharge Summary   Patient ID: Margaret Douglas MRN: [id number] DOB/AGE: 1956-11-30 68 y.o.  Admit date: 04/08/2024 Discharge date: 04/15/2024    PCP: Monique Ano, MD  DISCHARGE DIAGNOSES:    Acute respiratory failure with hypoxia (HCC)   History of breast cancer   Acute bronchitis   Polycythemia   CKD (chronic kidney disease) stage 3, GFR 30-59 ml/min (HCC)   Macrocytic anemia Community-acquired pneumonia   RECOMMENDATIONS FOR OUTPATIENT FOLLOW UP: Ambulatory referral sent to pulmonology for outpatient follow-up Cardiology to schedule outpatient follow-up Complete metabolic panel to be checked at follow-up to see if liver function tests have become normal.   Home Health: PT OT RN Equipment/Devices: Home oxygen.  Nebulizer machine  CODE STATUS: Full code  DISCHARGE CONDITION: fair  Diet recommendation: Heart the  INITIAL HISTORY: 68 y.o. female with history of prior breast cancer status postmastectomy and radiation, ongoing tobacco abuse presented to the ER because of worsening shortness of breath.  Workup revealed a Rhino Enterovirus CAP and suspected COPD exacerbation.  She was hospitalized for further management   Consultants: Cardiology   Procedures: Echocardiogram  HOSPITAL COURSE:   Acute Respiratory Failure with Hypoxia in the setting of community-acquired Pneumonia and Suspected Acute Exacerbation of COPD from Rhino/Enterovirus Was initially requiring 5 L of oxygen by nasal cannula.  Now down to 3 L. Desaturated when taken off of oxygen.  Will need home oxygen. She completed course of antibiotics. Respiratory status has improved.  She was changed over to inhaled steroids.  She will be discharged on prednisone  taper. Ambulatory referral sent to pulmonology for outpatient follow-up Smoking cessation counseling was provided.     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 Cardiology  was consulted. Patient was given 1 dose of furosemide . Not thought to be overtly fluid overloaded currently. Patient started on GDMT including Entresto  spironolactone .  Noted to be on metoprolol . Cardiology to schedule outpatient follow-up with consideration of coronary CTA.   Transaminitis Probably due to acute illness.  Improving LFTs noted.  Hepatitis panel was unremarkable.  Right upper quadrant ultrasound showed fatty liver.  Incidental cholelithiasis without any complicating features were noted.  Bilirubin is normal.  Alkaline phosphatase noted to be normal.     Prior history of Breast Cancer S/p Lumpectomy and Radiation.   Hypothyroidism TSH 23.736 and Free T4 was <0.25. Reinitiated Levothyroxine  at 100 mcg po Daily  Thyroid  function test will need to be rechecked in a few weeks.   Erthrocytosis and Macrocytosis Most likely due to tobacco abuse.  B12 and folic acid levels normal.   Obesity Estimated body mass index is 33 kg/m as calculated from the following:   Height as of 03/06/20: 5\' 10"  (1.778 m).   Weight as of this encounter: 104.3 kg.   Patient stable.  Okay for discharge home today.   PERTINENT LABS:  The results of significant diagnostics from this hospitalization (including imaging, microbiology, ancillary and laboratory) are listed below for reference.    Microbiology: Recent Results (from the past 240 hours)  Resp panel by RT-PCR (RSV, Flu A&B, Covid) Anterior Nasal Swab     Status: None   Collection Time: 04/08/24  9:47 PM   Specimen: Anterior Nasal Swab  Result Value Ref Range Status   SARS Coronavirus 2 by RT PCR NEGATIVE NEGATIVE Final   Influenza A by PCR NEGATIVE NEGATIVE Final   Influenza B by PCR NEGATIVE NEGATIVE Final    Comment: (NOTE)  The Xpert Xpress SARS-CoV-2/FLU/RSV plus assay is intended as an aid in the diagnosis of influenza from Nasopharyngeal swab specimens and should not be used as a sole basis for treatment. Nasal washings  and aspirates are unacceptable for Xpert Xpress SARS-CoV-2/FLU/RSV testing.  Fact Sheet for Patients: BloggerCourse.com  Fact Sheet for Healthcare Providers: SeriousBroker.it  This test is not yet approved or cleared by the [place]  FDA and has been authorized for detection and/or diagnosis of SARS-CoV-2 by FDA under an Emergency Use Authorization (EUA). This EUA will remain in effect (meaning this test can be used) for the duration of the COVID-19 declaration under Section 564(b)(1) of the Act, 21 U.S.C. section 360bbb-3(b)(1), unless the authorization is terminated or revoked.     Resp Syncytial Virus by PCR NEGATIVE NEGATIVE Final    Comment: (NOTE) Fact Sheet for Patients: BloggerCourse.com  Fact Sheet for Healthcare Providers: SeriousBroker.it  This test is not yet approved or cleared by the [place]  FDA and has been authorized for detection and/or diagnosis of SARS-CoV-2 by FDA under an Emergency Use Authorization (EUA). This EUA will remain in effect (meaning this test can be used) for the duration of the COVID-19 declaration under Section 564(b)(1) of the Act, 21 U.S.C. section 360bbb-3(b)(1), unless the authorization is terminated or revoked.  Performed at [facility], [address], [place], [place] [address]   Respiratory (~20 pathogens) panel by PCR     Status: Abnormal   Collection Time: 04/08/24  9:47 PM   Specimen: Nasopharyngeal Swab; Respiratory  Result Value Ref Range Status   Adenovirus NOT DETECTED NOT DETECTED Final   Coronavirus 229E NOT DETECTED NOT DETECTED Final    Comment: (NOTE) The Coronavirus on the Respiratory Panel, DOES NOT test for the novel  Coronavirus (2019 nCoV)    Coronavirus HKU1 NOT DETECTED NOT DETECTED Final   Coronavirus NL63 NOT DETECTED NOT DETECTED Final   Coronavirus OC43 NOT DETECTED NOT DETECTED Final    Metapneumovirus NOT DETECTED NOT DETECTED Final   Rhinovirus / Enterovirus DETECTED (A) NOT DETECTED Final   Influenza A NOT DETECTED NOT DETECTED Final   Influenza B NOT DETECTED NOT DETECTED Final   Parainfluenza Virus 1 NOT DETECTED NOT DETECTED Final   Parainfluenza Virus 2 NOT DETECTED NOT DETECTED Final   Parainfluenza Virus 3 NOT DETECTED NOT DETECTED Final   Parainfluenza Virus 4 NOT DETECTED NOT DETECTED Final   Respiratory Syncytial Virus NOT DETECTED NOT DETECTED Final   Bordetella pertussis NOT DETECTED NOT DETECTED Final   Bordetella Parapertussis NOT DETECTED NOT DETECTED Final   Chlamydophila pneumoniae NOT DETECTED NOT DETECTED Final   Mycoplasma pneumoniae NOT DETECTED NOT DETECTED Final    Comment: Performed at [facility], [address] [place], [place] [address]     Labs:   Basic Metabolic Panel: Recent Labs  Lab 04/10/24 1537 04/10/24 2112 04/11/24 0541 04/12/24 0651 04/13/24 0644 04/14/24 0537 04/15/24 0654  NA  --  136 137 136 137 139 139  K  --  4.2 4.2 4.5 3.8 3.5 4.2  CL  --  96* 99 98 92* 96* 99  CO2  --  29 27 26 29 30 29   GLUCOSE  --  142* 91 123* 143* 108* 111*  BUN  --  27* 27* 22 24* 30* 35*  CREATININE  --  0.89 0.82 0.84 1.03* 0.91 0.93  CALCIUM  --  9.4 9.1 9.4 9.2 9.3 9.0  MG  --  2.1 2.1 2.0 2.2  2.1  --   PHOS 2.5  --  2.2* 1.9* 3.6 2.8  --    Liver Function Tests: Recent Labs  Lab 04/11/24 0541 04/12/24 0651 04/13/24 0644 04/14/24 0537 04/15/24 0654  AST 100* 131* 174* 99* 88*  ALT 46* 65* 98* 76* 72*  ALKPHOS 63 67 86 61 57  BILITOT 1.2 0.9 1.4* 1.2 1.2  PROT 5.9* 6.0* 7.5 5.8* 6.0*  ALBUMIN 3.5 3.6 4.4 3.4* 3.4*    CBC: Recent Labs  Lab 04/10/24 1101 04/11/24 0541 04/12/24 0651 04/13/24 0644 04/14/24 0537  WBC 8.0 7.6 6.9 7.4 6.7  NEUTROABS 6.6 6.2 6.1 6.6 5.2  HGB 16.6* 15.6* 17.0* 19.7* 17.4*  HCT 48.3* 45.4 48.4* 55.5* 49.3*  MCV 107.3* 106.3* 105.0* 103.9* 102.5*  PLT 175 167 178 209 185     IMAGING STUDIES DG CHEST PORT 1 VIEW Result Date: 04/13/2024 CLINICAL DATA:  Shortness of breath. EXAM: PORTABLE CHEST 1 VIEW COMPARISON:  Apr 12, 2024 FINDINGS: The heart size and mediastinal contours are within normal limits. Minimal left lingular subsegmental atelectasis is noted. Right lung is clear. Left pleural effusion is significantly smaller compared to prior exam. The visualized skeletal structures are unremarkable. IMPRESSION: Left pleural effusion is significantly smaller. Minimal left lingular subsegmental atelectasis. Electronically Signed   By: Rosalene Colon M.D.   On: 04/13/2024 10:13   US  Abdomen Limited RUQ (LIVER/GB) Result Date: 04/13/2024 CLINICAL DATA:  Abnormal LFTs EXAM: ULTRASOUND ABDOMEN LIMITED RIGHT UPPER QUADRANT COMPARISON:  CT from 04/09/2024 FINDINGS: Gallbladder: Gallbladder is well distended. Multiple gallstones and gallbladder sludge are noted. No wall thickening or pericholecystic fluid is noted. Negative sonographic Murphy's sign is elicited. Common bile duct: Diameter: 0.2 mm. Liver: Diffusely increased in echogenicity consistent with fatty infiltration. No focal mass is noted. Portal vein is patent on color Doppler imaging with normal direction of blood flow towards the liver. Other: None. IMPRESSION: Fatty liver. Cholelithiasis and gallbladder sludge without complicating factors. Electronically Signed   By: Violeta Grey M.D.   On: 04/13/2024 00:00   DG CHEST PORT 1 VIEW Result Date: 04/12/2024 CLINICAL DATA:  Shortness of breath. EXAM: PORTABLE CHEST 1 VIEW COMPARISON:  AP chest 04/11/2024, 04/10/2024, 04/08/2024, 01/27/2006; CT chest 01/29/2016 FINDINGS: Cardiac silhouette is again at the upper limits of normal size. Mediastinal contours are within normal limits. Moderate atherosclerotic calcifications. Compared to yesterday, interval increase in peripheral lateral left hemithorax density extending up to the mid height, combined with increased blunting of the left  costophrenic angle. This presumably represents interval worsening of a mild to moderate left pleural effusion. Prominent right and left epicardial fat pads as seen on prior CT. Mild bilateral mid and lower lung interstitial thickening is unchanged. Moderate multilevel degenerative disc changes of the thoracic spine. Old healed left clavicle midshaft fracture. IMPRESSION: 1. Compared to yesterday, interval increase in peripheral lateral left pleural density/thickening, combined with increased blunting of the left costophrenic angle. This likely represents interval worsening of a mild to moderate left pleural effusion. 2. Mild bilateral mid and lower lung interstitial thickening is unchanged. Electronically Signed   By: Bertina Broccoli M.D.   On: 04/12/2024 09:29   DG CHEST PORT 1 VIEW Result Date: 04/11/2024 CLINICAL DATA:  Shortness of breath. EXAM: PORTABLE CHEST 1 VIEW COMPARISON:  04/10/2024 FINDINGS: Stable heart size. Stable opacity in the right cardiophrenic angle corresponding to prominent epicardial fat pad seen on prior CT. The lungs are otherwise clear. Old fracture deformity of left clavicle again noted. IMPRESSION: Stable  exam with prominent epicardial fat pad. No active lung disease. Electronically Signed   By: Marlyce Sine M.D.   On: 04/11/2024 09:49   DG CHEST PORT 1 VIEW Result Date: 04/10/2024 CLINICAL DATA:  Dyspnea EXAM: PORTABLE CHEST 1 VIEW COMPARISON:  04/08/2024 FINDINGS: Mild bibasilar atelectasis. Stable right cardiophrenic opacity corresponding to prominent epicardial fat better seen on CT examination of 04/09/2024. Lungs are otherwise clear. No pneumothorax or pleural effusion. Stable borderline cardiomegaly. Pulmonary vascularity is normal. No acute bone abnormality. IMPRESSION: 1. Mild bibasilar atelectasis. Electronically Signed   By: Worthy Heads M.D.   On: 04/10/2024 12:44   ECHOCARDIOGRAM COMPLETE Result Date: 04/09/2024    ECHOCARDIOGRAM REPORT   Patient Name:   ALISSIA LAFAZIA Date of Exam: 04/09/2024 Medical Rec #:  [id number]        Height:       70.0 in Accession #:    [id number]       Weight:       230.0 lb Date of Birth:  03-04-1956       BSA:          2.215 m Patient Age:    67 years         BP:           125/72 mmHg Patient Gender: F                HR:           79 bpm. Exam Location:  Inpatient Procedure: 2D Echo, Cardiac Doppler and Color Doppler (Both Spectral and Color            Flow Doppler were utilized during procedure). Indications:    Dyspnea  History:        Patient has no prior history of Echocardiogram examinations.  Sonographer:    Juanita Shaw Referring Phys: [id number] ARSHAD N KAKRAKANDY IMPRESSIONS  1. Left ventricular ejection fraction, by estimation, is 35 to 40%. The left ventricle has moderately decreased function. The left ventricle demonstrates global hypokinesis with septal-lateral dyssynchrony suggestive of IVCD. Left ventricular diastolic parameters are consistent with Grade I diastolic dysfunction (impaired relaxation).  2. Right ventricular systolic function is normal. The right ventricular size is normal. Tricuspid regurgitation signal is inadequate for assessing PA pressure.  3. The mitral valve is normal in structure. No evidence of mitral valve regurgitation. No evidence of mitral stenosis.  4. The aortic valve was not well visualized. Aortic valve regurgitation is not visualized. No aortic stenosis is present.  5. The inferior vena cava is normal in size with greater than 50% respiratory variability, suggesting right atrial pressure of 3 mmHg.  6. Technically difficult study with poor acoustic windows. FINDINGS  Left Ventricle: Left ventricular ejection fraction, by estimation, is 35 to 40%. The left ventricle has moderately decreased function. The left ventricle demonstrates global hypokinesis. The left ventricular internal cavity size was normal in size. There is no left ventricular hypertrophy. Left ventricular diastolic parameters are consistent  with Grade I diastolic dysfunction (impaired relaxation). Right Ventricle: The right ventricular size is normal. No increase in right ventricular wall thickness. Right ventricular systolic function is normal. Tricuspid regurgitation signal is inadequate for assessing PA pressure. Left Atrium: Left atrial size was normal in size. Right Atrium: Right atrial size was normal in size. Pericardium: Trivial pericardial effusion is present. Mitral Valve: The mitral valve is normal in structure. Mild to moderate mitral annular calcification. No evidence of mitral valve regurgitation. No evidence of mitral valve  stenosis. MV peak gradient, 4.9 mmHg. The mean mitral valve gradient is 3.0 mmHg. Tricuspid Valve: The tricuspid valve is normal in structure. Tricuspid valve regurgitation is not demonstrated. Aortic Valve: The aortic valve was not well visualized. Aortic valve regurgitation is not visualized. No aortic stenosis is present. Aortic valve mean gradient measures 6.0 mmHg. Aortic valve peak gradient measures 10.0 mmHg. Aortic valve area, by VTI measures 1.68 cm. Pulmonic Valve: The pulmonic valve was not well visualized. Pulmonic valve regurgitation is not visualized. Aorta: The aortic root is normal in size and structure. Venous: The inferior vena cava is normal in size with greater than 50% respiratory variability, suggesting right atrial pressure of 3 mmHg. IAS/Shunts: No atrial level shunt detected by color flow Doppler.  LEFT VENTRICLE PLAX 2D LVIDd:         5.50 cm      Diastology LVIDs:         3.70 cm      LV e' medial:    5.77 cm/s LV PW:         1.10 cm      LV E/e' medial:  10.5 LV IVS:        0.80 cm      LV e' lateral:   8.59 cm/s LVOT diam:     2.10 cm      LV E/e' lateral: 7.1 LV SV:         53 LV SV Index:   24 LVOT Area:     3.46 cm  LV Volumes (MOD) LV vol d, MOD A2C: 114.0 ml LV vol d, MOD A4C: 135.0 ml LV vol s, MOD A2C: 54.2 ml LV vol s, MOD A4C: 62.9 ml LV SV MOD A2C:     59.8 ml LV SV MOD A4C:      135.0 ml LV SV MOD BP:      67.3 ml RIGHT VENTRICLE             IVC RV Basal diam:  2.80 cm     IVC diam: 0.90 cm RV Mid diam:    2.10 cm RV S prime:     10.60 cm/s TAPSE (M-mode): 2.0 cm LEFT ATRIUM             Index        RIGHT ATRIUM          Index LA diam:        2.90 cm 1.31 cm/m   RA Area:     8.80 cm LA Vol (A2C):   41.6 ml 18.78 ml/m  RA Volume:   14.50 ml 6.55 ml/m LA Vol (A4C):   30.9 ml 13.95 ml/m LA Biplane Vol: 35.9 ml 16.21 ml/m  AORTIC VALVE                     PULMONIC VALVE AV Area (Vmax):    1.93 cm      PV Vmax:       0.74 m/s AV Area (Vmean):   1.59 cm      PV Peak grad:  2.2 mmHg AV Area (VTI):     1.68 cm AV Vmax:           158.00 cm/s AV Vmean:          119.000 cm/s AV VTI:            0.314 m AV Peak Grad:      10.0 mmHg AV Mean Grad:  6.0 mmHg LVOT Vmax:         87.90 cm/s LVOT Vmean:        54.500 cm/s LVOT VTI:          0.152 m LVOT/AV VTI ratio: 0.48  AORTA Ao Root diam: 3.00 cm Ao Asc diam:  3.10 cm MITRAL VALVE MV Area (PHT): 3.65 cm    SHUNTS MV Area VTI:   1.95 cm    Systemic VTI:  0.15 m MV Peak grad:  4.9 mmHg    Systemic Diam: 2.10 cm MV Mean grad:  3.0 mmHg MV Vmax:       1.11 m/s MV Vmean:      80.8 cm/s MV Decel Time: 208 msec MV E velocity: 60.60 cm/s MV A velocity: 95.20 cm/s MV E/A ratio:  0.64 Dalton McleanMD Electronically signed by Archer Bear Signature Date/Time: 04/09/2024/10:11:13 AM    Final    CT CHEST WO CONTRAST Result Date: 04/09/2024 CLINICAL DATA:  Pneumonia. EXAM: CT CHEST WITHOUT CONTRAST TECHNIQUE: Multidetector CT imaging of the chest was performed following the standard protocol without IV contrast. RADIATION DOSE REDUCTION: This exam was performed according to the departmental dose-optimization program which includes automated exposure control, adjustment of the mA and/or kV according to patient size and/or use of iterative reconstruction technique. COMPARISON:  01/29/2016 FINDINGS: Cardiovascular: The heart size is normal. No  substantial pericardial effusion. Coronary artery calcification is evident. Mild atherosclerotic calcification is noted in the wall of the thoracic aorta. Mediastinum/Nodes: No mediastinal lymphadenopathy. No evidence for gross hilar lymphadenopathy although assessment is limited by the lack of intravenous contrast on the current study. The esophagus has normal imaging features. There is no axillary lymphadenopathy. Lungs/Pleura: No suspicious pulmonary nodule or mass. Dependent collapse/consolidation noted in both lower lobes, right greater than left. No pleural effusion. Upper Abdomen: The liver shows diffusely decreased attenuation suggesting fat deposition. Musculoskeletal: No worrisome lytic or sclerotic osseous abnormality. IMPRESSION: 1. Dependent collapse/consolidation in both lower lobes, right greater than left. Imaging features likely reflect atelectasis although a degree of superimposed pneumonia is not excluded. 2. Hepatic steatosis. 3.  Aortic Atherosclerosis (ICD10-I70.0). Electronically Signed   By: Donnal Fusi M.D.   On: 04/09/2024 07:37   DG Chest Port 1 View Result Date: 04/08/2024 CLINICAL DATA:  Short of breath EXAM: PORTABLE CHEST 1 VIEW COMPARISON:  01/27/2006 FINDINGS: Single frontal view of the chest demonstrates an unremarkable cardiac silhouette. There is diffuse increased interstitial prominence of uncertain acuity. No airspace disease, effusion, or pneumothorax. No acute bony abnormalities. IMPRESSION: 1. Diffuse increased interstitial prominence, which could be related to chronic interstitial scarring, atypical infection such as viral pneumonitis, or developing interstitial edema. Electronically Signed   By: Bobbye Burrow M.D.   On: 04/08/2024 22:04    DISCHARGE EXAMINATION: Vitals:   04/14/24 0807 04/14/24 1530 04/14/24 1948 04/15/24 0818  BP: 122/66 112/70 107/73 (!) 142/83  Pulse: 66 66 64 78  Resp: 18 18 18 18   Temp: (!) 97.5 F (36.4 C) 97.7 F (36.5 C) 97.8 F  (36.6 C) 97.8 F (36.6 C)  TempSrc: Oral  Oral Oral  SpO2: 92% 94% 92% 92%  Weight:       General appearance: Awake alert.  In no distress Resp: Clear to auscultation bilaterally.  Normal effort Cardio: S1-S2 is normal regular.  No S3-S4.  No rubs murmurs or bruit GI: Abdomen is soft.  Nontender nondistended.  Bowel sounds are present normal.  No masses organomegaly    DISPOSITION: Home  Discharge  Instructions     (HEART FAILURE PATIENTS) Call MD:  Anytime you have any of the following symptoms: 1) 3 pound weight gain in 24 hours or 5 pounds in 1 week 2) shortness of breath, with or without a dry hacking cough 3) swelling in the hands, feet or stomach 4) if you have to sleep on extra pillows at night in order to breathe.   Complete by: As directed    Call MD for:  difficulty breathing, headache or visual disturbances   Complete by: As directed    Call MD for:  extreme fatigue   Complete by: As directed    Call MD for:  persistant dizziness or light-headedness   Complete by: As directed    Call MD for:  persistant nausea and vomiting   Complete by: As directed    Call MD for:  severe uncontrolled pain   Complete by: As directed    Call MD for:  temperature >100.4   Complete by: As directed    Diet general   Complete by: As directed    Discharge instructions   Complete by: As directed    Please take your medications as prescribed.  Please stop smoking cigarettes.  Seek attention if your symptoms worsen.  A referral has been sent to the lung doctor for outpatient follow-up. The cardiologist will also schedule outpatient follow-up. You will need to see your primary care provider to have blood work done in 1 to 2 weeks.  You were cared for by a hospitalist during your hospital stay. If you have any questions about your discharge medications or the care you received while you were in the hospital after you are discharged, you can call the unit and asked to speak with the hospitalist  on call if the hospitalist that took care of you is not available. Once you are discharged, your primary care physician will handle any further medical issues. Please note that NO REFILLS for any discharge medications will be authorized once you are discharged, as it is imperative that you return to your primary care physician (or establish a relationship with a primary care physician if you do not have one) for your aftercare needs so that they can reassess your need for medications and monitor your lab values. If you do not have a primary care physician, you can call [phone] for a physician referral.   Increase activity slowly   Complete by: As directed    Pulmonary Visit   Complete by: As directed    COPD   Reason for referral: Other Pulmonary         Allergies as of 04/15/2024   Not on File      Medication List     STOP taking these medications    BC HEADACHE POWDER PO   doxycycline  100 MG capsule Commonly known as: VIBRAMYCIN    naproxen sodium 220 MG tablet Commonly known as: ALEVE       TAKE these medications    Breo Ellipta  200-25 MCG/ACT Aepb Generic drug: fluticasone  furoate-vilanterol Inhale 1 puff into the lungs daily.   Entresto  24-26 MG Generic drug: sacubitril -valsartan  Take 1 tablet by mouth 2 (two) times daily.   guaiFENesin  600 MG 12 hr tablet Commonly known as: MUCINEX  Take 2 tablets (1,200 mg total) by mouth 2 (two) times daily as needed.   ipratropium-albuterol  0.5-2.5 (3) MG/3ML Soln Commonly known as: DUONEB Take 3 mLs by nebulization every 2 (two) hours as needed.   levothyroxine  100 MCG tablet  Commonly known as: SYNTHROID  Take 1 tablet (100 mcg total) by mouth daily at 6 (six) AM.   metoprolol  succinate 25 MG 24 hr tablet Commonly known as: TOPROL -XL Take 0.5 tablets (12.5 mg total) by mouth daily.   MULTIVITAMIN WOMEN 50+ PO Take 1 capsule by mouth daily.   predniSONE  20 MG tablet Commonly known as: DELTASONE  Take 3 tablets (60  mg total) by mouth daily for 3 days, THEN 2 tablets (40 mg total) daily for 3 days, THEN 1 tablet (20 mg total) daily for 3 days. Start taking on: Apr 15, 2024   spironolactone  25 MG tablet Commonly known as: ALDACTONE  Take 0.5 tablets (12.5 mg total) by mouth daily.          Follow-up Information     Monique Ano, MD Follow up.   Specialty: Family Medicine Why: for bloodwork: CBC and CMET Contact information: [address] [facility] [place] [address] [phone]         [facility] Follow up.   Specialty: Pulmonology Contact information: [address] [place] [address] [place]  [address] [phone] Additional information: [address]  [address]  [place], [place] [address]                TOTAL DISCHARGE TIME: 35 minutes  Thelia Tanksley Lyndon Santiago  [facility] Pager on [email or web address]  04/16/2024, 12:06 PM

## 2024-04-15 NOTE — Progress Notes (Signed)
 Reviewed AVS, patient expressed understanding of medications, MD follow up reviewed.   Removed IV, Site clean, dry and intact.  Patient states all belongings brought to the hospital at time of admission are accounted for and packed to take home.  Patient informed and expressed understanding to pick up medications from [facility] pharmacy.

## 2024-04-15 NOTE — TOC Progression Note (Signed)
 Transition of Care (TOC) - Progression Note   NCM yesterday ordered home oxygen with Jermaine with [employer]. MD added order for NEB machine today. Jermaine aware and will bring NEB machine to hospital room shortly.   Notified Amy with [facility] discharge is today  Patient Details  Name: Margaret Douglas MRN: [id number] Date of Birth: 1956-08-04  Transition of Care [facility]) CM/SW Contact  Lovette Merta, Arturo Late, RN Phone Number: 04/15/2024, 9:59 AM  Clinical Narrative:       Expected Discharge Plan: Home w Home Health Services Barriers to Discharge: Continued Medical Work up  Expected Discharge Plan and Services   Discharge Planning Services: CM Consult Post Acute Care Choice: Home Health, Durable Medical Equipment Living arrangements for the past 2 months: Single Family Home Expected Discharge Date: 04/15/24               DME Arranged: Oxygen DME Agency: [employer] Date DME Agency Contacted: 04/14/24 Time DME Agency Contacted: 1106 Representative spoke with at DME Agency: Zula Hitch HH Arranged: PT, OT HH Agency: [facility] Date [facility] Agency Contacted: 04/14/24 Time HH Agency Contacted: 1106 Representative spoke with at [facility] Agency: Amy   Social Determinants of Health (SDOH) Interventions SDOH Screenings   Food Insecurity: No Food Insecurity (04/09/2024)  Housing: Low Risk  (04/09/2024)  Transportation Needs: No Transportation Needs (04/09/2024)  Utilities: Not At Risk (04/09/2024)  Social Connections: Socially Isolated (04/09/2024)  Tobacco Use: High Risk (04/09/2024)    Readmission Risk Interventions     No data to display

## 2024-05-04 ENCOUNTER — Telehealth: Payer: Self-pay | Admitting: *Deleted

## 2024-05-04 NOTE — Progress Notes (Unsigned)
 Cardiology Office Note:  .   Date:  05/05/2024  ID:  Margaret Douglas, DOB 1956-08-20, MRN [id number] PCP: Monique Ano, MD  [facility] Providers Cardiologist:  Alexandria Angel, MD     History of Present Illness: .   Margaret Douglas is a 68 y.o. female with history of chronic HFrEF, hypertension, hyperlipidemia, diabetes, COPD chronically on supplemental oxygen, breast cancer status postchemotherapy/radiation/partial mastectomy, hypothyroidism, nicotine dependence.  Per chart review, patient is established with [facility].  Last seen by their cardiologist October 2023.  Our inpatient cardiology team saw patient in the hospital May 2025 for worsening complaints of shortness of breath.  Echocardiogram showing newly reduced EF 35 to 40%.  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  She only required 1 dose of IV Lasix , titrated on GDMT.  Outpatient plans for coronary CTA.  Today patient presents for hospital follow-up, overall doing well.  Shortness of breath has significantly improved.  No other exacerbations.  Not having issues with orthopnea or peripheral edema.  She is minimally ambulatory but tries to walk with her walker occasionally.  She tells me that she has quit smoking and drinking completely for about 8 weeks now.  Reports that she smoked about half a pack her entire life.  Drinking vodka every other day, but denied heavy use.  Overall seems to be tolerating GDMT decently.  Mild complaints of dizziness.  Has not been checking her blood pressure though.  Lives at home with her son.  ROS: Denies: Chest pain, shortness of breath, orthopnea, peripheral edema, palpitations, decreased exercise intolerance, fatigue.   Studies Reviewed: .         Risk Assessment/Calculations:             Physical Exam:   VS:  BP 106/62   Pulse 82   Ht 5\' 10"  (1.778 m)   SpO2 91%   BMI 33.00 kg/m    Wt Readings from Last 3  Encounters:  04/08/24 230 lb (104.3 kg)  03/17/20 211 lb 4.8 oz (95.8 kg)  03/06/20 210 lb 1.6 oz (95.3 kg)    GEN: Well nourished, well developed in no acute distress NECK: No JVD; No carotid bruits CARDIAC: RRR, no murmurs, rubs, gallops RESPIRATORY:  Clear to auscultation without rales, wheezing or rhonchi  ABDOMEN: Soft, non-tender, non-distended EXTREMITIES:  No edema; No deformity   ASSESSMENT AND PLAN: .     Newly diagnosed HFrEF Echocardiogram May 2025 EF 35 to 40% with septal lateral dyssynchrony suggestive of IVCD, normal RV.  No significant valvular disease.  Overall decently compensated, requires no diuretics.  Some of her shortness of breath likely is a mixture of both her COPD and CHF. GDMT: Continue Toprol -XL 25 mg, Entresto  24-26 mg twice daily, spironolactone  12.5 mg.  Obtain CMP today. BP little bit soft in office 106/62.  Recommended she log her blood pressure for the next 2 weeks twice a day and bring to upcoming appointment.  Would aim to add SGLT2 inhibitor, denies UTI/yeast infections..  Blood pressures today deferring. Recommend coronary CTA to rule out obstructive CAD. Dry weight seems to be around 230  Hyperlipidemia Lipid panel 3 weeks ago showing LDL 69.  Not on statin therapy, reasonably controlled unless there is evidence of obstructive CAD.  Diabetes Reported to be borderline, no recent A1c.  Would would obtain with PCP  COPD Nicotine dependence Chronically required supplemental oxygen, has appointment with pulmonology.  Has completely stopped smoking cold  [place], congratulated her.  Elevated LFTs Likely related to EtOH use, no obvious history of cirrhosis.  Hepatitis panel negative, ultrasound showing fatty liver.  Again stopped altogether, great job.  Check CMP today, LFTs were downtrending during admission.  Hypothyroidism TSH 23.7.  T4 was less than 0.25.  Restarted on Synthroid  during admission, was not taking PTA. Follow up with PCP      Dispo: She is well-established within the [facility] system.  She would like to follow-up with them and call to make appointment.  Will route my note to her primary cardiologist there.  Recommend getting coronary CT or some other type of ischemic evaluation.  Follow-up on GDMT.  Signed, Burnetta Cart, PA-C

## 2024-05-04 NOTE — Telephone Encounter (Signed)
 Call placed to pt regarding appt tomorrow, 05/05/24.  Asked pt to call me back.

## 2024-05-05 ENCOUNTER — Encounter: Payer: Self-pay | Admitting: Cardiology

## 2024-05-05 ENCOUNTER — Ambulatory Visit: Attending: Physician Assistant | Admitting: Cardiology

## 2024-05-05 VITALS — BP 106/62 | HR 82 | Ht 70.0 in

## 2024-05-05 DIAGNOSIS — J439 Emphysema, unspecified: Secondary | ICD-10-CM | POA: Diagnosis not present

## 2024-05-05 DIAGNOSIS — I10 Essential (primary) hypertension: Secondary | ICD-10-CM

## 2024-05-05 DIAGNOSIS — I5020 Unspecified systolic (congestive) heart failure: Secondary | ICD-10-CM | POA: Diagnosis not present

## 2024-05-05 DIAGNOSIS — E039 Hypothyroidism, unspecified: Secondary | ICD-10-CM | POA: Diagnosis not present

## 2024-05-05 NOTE — Patient Instructions (Signed)
 Medication Instructions:  Your physician recommends that you continue on your current medications as directed. Please refer to the Current Medication list given to you today.  *If you need a refill on your cardiac medications before your next appointment, please call your pharmacy*  Lab Work: TODAY:  CMET  If you have labs (blood work) drawn today and your tests are completely normal, you will receive your results only by: MyChart Message (if you have MyChart) OR A paper copy in the mail If you have any lab test that is abnormal or we need to change your treatment, we will call you to review the results.  Testing/Procedures: None ordered  Follow-Up: At [facility], you and your health needs are our priority.  As part of our continuing mission to provide you with exceptional heart care, our providers are all part of one team.  This team includes your primary Cardiologist (physician) and Advanced Practice Providers or APPs (Physician Assistants and Nurse Practitioners) who all work together to provide you with the care you need, when you need it.  Your next appointment:   NEED TO CALL [facility] AND GET AN APPOINTMENT.    Provider:       We recommend signing up for the patient portal called "MyChart".  Sign up information is provided on this After Visit Summary.  MyChart is used to connect with patients for Virtual Visits (Telemedicine).  Patients are able to view lab/test results, encounter notes, upcoming appointments, etc.  Non-urgent messages can be sent to your provider as well.   To learn more about what you can do with MyChart, go to [email or web address].   Other Instructions Your physician has requested that you regularly monitor and record your blood pressure readings at home. Please use the same machine at the same time of day to check your readings and record them to bring to your follow-up visit.   Please monitor blood pressures TWO TIMES A DAY and keep a  log of your readings and take with you to [facility] so they can adjust medications accordingly.    Make sure to check 2 hours after your medications.    AVOID these things for 30 minutes before checking your blood pressure: No Drinking caffeine. No Drinking alcohol. No Eating. No Smoking. No Exercising.   Five minutes before checking your blood pressure: Pee. Sit in a dining chair. Avoid sitting in a soft couch or armchair. Be quiet. Do not talk

## 2024-05-06 LAB — COMPREHENSIVE METABOLIC PANEL WITH GFR
ALT: 51 IU/L — ABNORMAL HIGH (ref 0–32)
AST: 44 IU/L — ABNORMAL HIGH (ref 0–40)
Albumin: 4 g/dL (ref 3.9–4.9)
Alkaline Phosphatase: 128 IU/L — ABNORMAL HIGH (ref 44–121)
BUN/Creatinine Ratio: 14 (ref 12–28)
BUN: 8 mg/dL (ref 8–27)
Bilirubin Total: 1.3 mg/dL — ABNORMAL HIGH (ref 0.0–1.2)
CO2: 26 mmol/L (ref 20–29)
Calcium: 9.4 mg/dL (ref 8.7–10.3)
Chloride: 101 mmol/L (ref 96–106)
Creatinine, Ser: 0.58 mg/dL (ref 0.57–1.00)
Globulin, Total: 1.9 g/dL (ref 1.5–4.5)
Glucose: 113 mg/dL — ABNORMAL HIGH (ref 70–99)
Potassium: 3.6 mmol/L (ref 3.5–5.2)
Sodium: 145 mmol/L — ABNORMAL HIGH (ref 134–144)
Total Protein: 5.9 g/dL — ABNORMAL LOW (ref 6.0–8.5)
eGFR: 99 mL/min/{1.73_m2} (ref >59)

## 2024-05-09 ENCOUNTER — Telehealth: Admitting: Family

## 2024-05-09 DIAGNOSIS — I509 Heart failure, unspecified: Secondary | ICD-10-CM | POA: Diagnosis not present

## 2024-05-09 DIAGNOSIS — I959 Hypotension, unspecified: Secondary | ICD-10-CM | POA: Diagnosis not present

## 2024-05-09 NOTE — Progress Notes (Signed)
 Virtual Visit Consent   Margaret Douglas, you are scheduled for a virtual visit with a [facility] provider today. Just as with appointments in the office, your consent must be obtained to participate. Your consent will be active for this visit and any virtual visit you may have with one of our providers in the next 365 days. If you have a MyChart account, a copy of this consent can be sent to you electronically.  As this is a virtual visit, video technology does not allow for your provider to perform a traditional examination. This may limit your provider's ability to fully assess your condition. If your provider identifies any concerns that need to be evaluated in person or the need to arrange testing (such as labs, EKG, etc.), we will make arrangements to do so. Although advances in technology are sophisticated, we cannot ensure that it will always work on either your end or our end. If the connection with a video visit is poor, the visit may have to be switched to a telephone visit. With either a video or telephone visit, we are not always able to ensure that we have a secure connection.  By engaging in this virtual visit, you consent to the provision of healthcare and authorize for your insurance to be billed (if applicable) for the services provided during this visit. Depending on your insurance coverage, you may receive a charge related to this service.  I need to obtain your verbal consent now. Are you willing to proceed with your visit today? Margaret Douglas has provided verbal consent on 05/09/2024 for a virtual visit (video or telephone). Tommas Fragmin, FNP  Date: 05/09/2024 7:22 PM   Virtual Visit via Video Note   I, Tommas Fragmin, connected with  Margaret Douglas  ([id number], 69/06/1956) on 05/09/24 at  7:15 PM EDT by a video-enabled telemedicine application and verified that I am speaking with the correct person using two identifiers.  Location: Patient: Virtual Visit Location Patient:  Home Provider: Virtual Visit Location Provider: Home Office   I discussed the limitations of evaluation and management by telemedicine and the availability of in person appointments. The patient expressed understanding and agreed to proceed.    History of Present Illness: Margaret Douglas is a 68 y.o. who identifies as a female who was assigned female at birth, and is being seen today for hypotension.  Her BP has been 7am. 95/59 11am 100/58 3pm 78/43 7pm 85/48 Today's log 7am 86/52 11am 95/58 3pm 85/57   She is followed by Cardiologists for CHF and taking Entresto , spironolactone  12.5 mg, and metoprolol  12.5 mg.   HPI: HPI  Problems:  Patient Active Problem List   Diagnosis Date Noted   Acute bronchitis 04/09/2024   Acute respiratory failure with hypoxia (HCC) 04/09/2024   Polycythemia 04/09/2024   CKD (chronic kidney disease) stage 3, GFR 30-59 ml/min (HCC) 04/09/2024   Macrocytic anemia 04/09/2024   Pneumonia 04/09/2024   Goals of care, counseling/discussion 02/26/2020   Ductal carcinoma in situ (DCIS) of left breast 02/26/2020   Acquired hypothyroidism 02/22/2020   Borderline diabetes mellitus 02/22/2020   COPD (chronic obstructive pulmonary disease) (HCC) 02/22/2020   History of breast cancer 02/22/2020   Non morbid obesity due to excess calories 02/22/2020   Pure hypercholesterolemia 02/22/2020   Tobacco dependence 02/22/2020   Essential hypertension 06/10/2018    Allergies: Not on File Medications:  Current Outpatient Medications:    fluticasone  furoate-vilanterol (BREO ELLIPTA ) 200-25 MCG/ACT AEPB, Inhale 1 puff into  the lungs daily., Disp: 60 each, Rfl: 0   ipratropium-albuterol  (DUONEB) 0.5-2.5 (3) MG/3ML SOLN, Take 3 mLs by nebulization every 2 (two) hours as needed., Disp: 360 mL, Rfl: 0   levothyroxine  (SYNTHROID ) 100 MCG tablet, Take 1 tablet (100 mcg total) by mouth daily at 6 (six) AM., Disp: 90 tablet, Rfl: 0   metoprolol  succinate (TOPROL -XL) 25 MG 24 hr  tablet, Take 0.5 tablets (12.5 mg total) by mouth daily., Disp: 30 tablet, Rfl: 0   Multiple Vitamins-Minerals (MULTIVITAMIN WOMEN 50+ PO), Take 1 capsule by mouth daily., Disp: , Rfl:    sacubitril -valsartan  (ENTRESTO ) 24-26 MG, Take 1 tablet by mouth 2 (two) times daily., Disp: 60 tablet, Rfl: 0   spironolactone  (ALDACTONE ) 25 MG tablet, Take 0.5 tablets (12.5 mg total) by mouth daily., Disp: 45 tablet, Rfl: 0  Observations/Objective: Patient is well-developed, well-nourished in no acute distress.  Resting comfortably  at home.  Head is normocephalic, atraumatic.  No labored breathing.  Speech is clear and coherent with logical content.  Patient is alert and oriented at baseline.    Assessment and Plan: 1. Hypotension, unspecified hypotension type (Primary)  2. Congestive heart failure, unspecified HF chronicity, unspecified heart failure type (HCC)  Given patients BP recommend holding metoprolol  and spironolactone  if BP <110/60 Pt will call Cardiologists for follow up Fall precautions discussed  When standing will count to 5 before moving Follow if symptoms worsen or do not impove  Follow Up Instructions: I discussed the assessment and treatment plan with the patient. The patient was provided an opportunity to ask questions and all were answered. The patient agreed with the plan and demonstrated an understanding of the instructions.  A copy of instructions were sent to the patient via MyChart unless otherwise noted below.     The patient was advised to call back or seek an in-person evaluation if the symptoms worsen or if the condition fails to improve as anticipated.    Tommas Fragmin, FNP

## 2024-05-10 ENCOUNTER — Ambulatory Visit: Payer: Self-pay | Admitting: Cardiology

## 2024-05-27 ENCOUNTER — Other Ambulatory Visit (HOSPITAL_COMMUNITY): Payer: Self-pay

## 2024-06-01 ENCOUNTER — Other Ambulatory Visit: Payer: Self-pay

## 2024-06-15 ENCOUNTER — Encounter: Payer: Self-pay | Admitting: Pulmonary Disease

## 2024-06-15 ENCOUNTER — Ambulatory Visit (INDEPENDENT_AMBULATORY_CARE_PROVIDER_SITE_OTHER): Admitting: Pulmonary Disease

## 2024-06-15 VITALS — BP 90/60 | HR 72 | Temp 98.2°F | Ht 70.0 in | Wt 216.8 lb

## 2024-06-15 DIAGNOSIS — J449 Chronic obstructive pulmonary disease, unspecified: Secondary | ICD-10-CM

## 2024-06-15 DIAGNOSIS — I10 Essential (primary) hypertension: Secondary | ICD-10-CM

## 2024-06-15 DIAGNOSIS — J9601 Acute respiratory failure with hypoxia: Secondary | ICD-10-CM

## 2024-06-15 DIAGNOSIS — Z87891 Personal history of nicotine dependence: Secondary | ICD-10-CM | POA: Diagnosis not present

## 2024-06-15 NOTE — Progress Notes (Signed)
 Subjective:    Patient ID: Margaret Douglas, female    DOB: 22-Apr-1956, 68 y.o.   MRN: [id number]  Patient Care Team: Alla Amis, MD as PCP - General (Family Medicine) Pietro Redell RAMAN, MD as PCP - Cardiology (Cardiology) Dannielle Arlean FALCON, RN (Inactive) as Registered Nurse Cindie Jesusa HERO, RN as Registered Nurse Rodolph Romano, MD as Consulting Physician (General Surgery) Melanee Annah BROCKS, MD as Consulting Physician (Oncology) Lenn Aran, MD as Radiation Oncologist (Radiation Oncology)  Chief Complaint  Patient presents with   Consult    Shortness of breath on exertion. Patient was discharged from hospital on oxygen. Patient has discontinued oxygen on her own.     BACKGROUND: Patient is a 68 year old former smoker with a 17-pack-year history of smoking who presents for pulmonary evaluation after an episode of acute respiratory failure with hypoxia in the setting of rhinovirus/enterovirus pneumonia.  She was admitted at [facility].  Admitted from 09 Apr 2019 25 through 15 Apr 2024.  Patient discharged on oxygen at 3 L/min however she has quit using it altogether over the last month.  Patient was also noted to have a dilated cardiomyopathy with EF of 35 to 40%.   HPI Discussed the use of AI scribe software for clinical note transcription with the patient, who gave verbal consent to proceed.  History of Present Illness   Margaret Douglas is a 68 year old female with a history as noted below, who presents for a general pulmonary evaluation following a recent hospitalization for pneumonia.  She presents with her son, Rozelle.  She was hospitalized in May for pneumonia and was discharged with oxygen therapy. Since discharge, she experiences dizziness upon standing. She has not been enrolled in a lung cancer screening program and has not had a pulmonary function test before.  She has a history of smoking, having quit three months ago after smoking half a pack per  day since she was 11 or 68 years old. She reports a significant improvement in her breathing since quitting smoking. She uses an incentive spirometer at home, which was provided during her hospital stay.  She mentions difficulty hearing in one ear for about two months, which has since resolved. She is unable to walk far without a walker. No swelling in her legs.   She quit using supplemental oxygen approximately a month ago.  Has not felt increasing shortness of breath off of it.  I have reviewed her [facility] records, her outpatient records, imaging available, and laboratory data.    Review of Systems A 10 point review of systems was performed and it is as noted above otherwise negative.   Past Medical History:  Diagnosis Date   Breast cancer (HCC) 2007   RT LUMPECTOMY   COPD (chronic obstructive pulmonary disease) (HCC)    Hypertension    Hypothyroidism    Personal history of chemotherapy    Personal history of radiation therapy    Pre-diabetes     Past Surgical History:  Procedure Laterality Date   BREAST BIOPSY Right 2007   breast ca rad and chemo   BREAST BIOPSY Left 02/14/2020   Affirm bx-x clip-DUCTAL CARCINOMA IN SITU, NUCLEAR GRADE 2-3, WITH COMEDONECROSIS   BREAST LUMPECTOMY     PARTIAL MASTECTOMY WITH NEEDLE LOCALIZATION AND AXILLARY SENTINEL LYMPH NODE BX Left 03/06/2020   Procedure: PARTIAL MASTECTOMY WITH NEEDLE LOCALIZATION AND AXILLARY SENTINEL LYMPH NODE BX;  Surgeon: Rodolph Romano, MD;  Location: [facility] ORS;  Service: General;  Laterality:  Left;    Patient Active Problem List   Diagnosis Date Noted   Acute bronchitis 04/09/2024   Acute respiratory failure with hypoxia (HCC) 04/09/2024   Polycythemia 04/09/2024   CKD (chronic kidney disease) stage 3, GFR 30-59 ml/min (HCC) 04/09/2024   Macrocytic anemia 04/09/2024   Pneumonia 04/09/2024   Goals of care, counseling/discussion 02/26/2020   Ductal carcinoma in situ (DCIS) of left breast 02/26/2020    Acquired hypothyroidism 02/22/2020   Borderline diabetes mellitus 02/22/2020   COPD (chronic obstructive pulmonary disease) (HCC) 02/22/2020   History of breast cancer 02/22/2020   Non morbid obesity due to excess calories 02/22/2020   Pure hypercholesterolemia 02/22/2020   Tobacco dependence 02/22/2020   Essential hypertension 06/10/2018    Family History  Problem Relation Age of Onset   COPD Mother    Healthy Father    Suicidality Brother 24   Breast cancer Neg Hx     Social History   Tobacco Use   Smoking status: Former    Current packs/day: 0.00    Average packs/day: 0.5 packs/day for 52.2 years (26.1 ttl pk-yrs)    Types: Cigarettes    Start date: 16    Quit date: 03/09/2024    Years since quitting: 0.2   Smokeless tobacco: Never  Substance Use Topics   Alcohol use: Yes    Alcohol/week: 1.0 standard drink of alcohol    Types: 1 Standard drinks or equivalent per week    Comment: daily    No Known Allergies  Current Meds  Medication Sig   fluticasone  furoate-vilanterol (BREO ELLIPTA ) 200-25 MCG/ACT AEPB Inhale 1 puff into the lungs daily.   ipratropium-albuterol  (DUONEB) 0.5-2.5 (3) MG/3ML SOLN Take 3 mLs by nebulization every 2 (two) hours as needed.   levothyroxine  (SYNTHROID ) 100 MCG tablet Take 1 tablet (100 mcg total) by mouth daily at 6 (six) AM.   metoprolol  succinate (TOPROL -XL) 25 MG 24 hr tablet Take 0.5 tablets (12.5 mg total) by mouth daily.   Multiple Vitamins-Minerals (MULTIVITAMIN WOMEN 50+ PO) Take 1 capsule by mouth daily.   sacubitril -valsartan  (ENTRESTO ) 24-26 MG Take 1 tablet by mouth 2 (two) times daily.   spironolactone  (ALDACTONE ) 25 MG tablet Take 0.5 tablets (12.5 mg total) by mouth daily.     There is no immunization history on file for this patient.      Objective:     BP 90/60 (BP Location: Left Arm, Patient Position: Sitting, Cuff Size: Normal)   Pulse 72   Temp 98.2 F (36.8 C) (Oral)   Ht 5' 10 (1.778 m)   Wt 216 lb 12.8  oz (98.3 kg)   SpO2 95%   BMI 31.11 kg/m   SpO2: 95 %  GENERAL: Chronically ill-appearing woman, no acute distress, HEAD: Normocephalic, atraumatic.  EYES: Pupils equal, round, reactive to light.  No scleral icterus.  MOUTH: Oral mucosa moist.  No thrush.  Mallampati II airway. NECK: Supple. No thyromegaly. Trachea midline. No JVD.  No adenopathy. PULMONARY: Good air entry bilaterally.  Coarse, otherwise, no adventitious sounds. CARDIOVASCULAR: S1 and S2. Regular rate and rhythm.  No rubs, murmurs or gallops heard. ABDOMEN: Benign. MUSCULOSKELETAL: No joint deformity, no clubbing, no edema.  NEUROLOGIC: Unsteady gait needs to be assisted.  Speech is fluent.  No overt localizing sign. SKIN: Intact,warm,dry. PSYCH: Flat affect, cooperative.     Ambulatory oxymetry was performed today:  At rest on room air oxygen saturation was 99%, the patient ambulated at a normal pace with walker, completed 3 laps, O2  nadir 96%, mild shortness of breath.  Resting heart rate was [place] 1 bpm at maximum for this exercise 91 bpm.  No evidence of significant oxygen desaturation with ambulation    Assessment & Plan:     ICD-10-CM   1. COPD suggested by initial evaluation (HCC)  J44.9 Pulmonary function test    AMB REFERRAL FOR DME    2. Acute respiratory failure with hypoxia (HCC)  J96.01 Pulmonary function test    AMB REFERRAL FOR DME    3. Essential hypertension  I10     4. Personal history of tobacco use, presenting hazards to health  Z87.891 Ambulatory Referral for Lung Cancer Scre      Orders Placed This Encounter  Procedures   Ambulatory Referral for Lung Cancer Scre    Referral Priority:   Routine    Referral Type:   Consultation    Referral Reason:   Specialty Services Required    Number of Visits Requested:   1   AMB REFERRAL FOR DME    Referral Priority:   Routine    Referral Type:   Durable Medical Equipment Purchase    Number of Visits Requested:   1   Pulmonary function  test    Standing Status:   Future    Expected Date:   07/16/2024    Expiration Date:   06/15/2025    Where should this test be performed?:   Outpatient Pulmonary    What type of PFT is being ordered?:   Full PFT   Discussion:    Chronic Obstructive Pulmonary Disease (COPD) COPD by clinical impression. Smoked half a pack per day since age 7 or 49, quit 3 months ago. Reports improved breathing since quitting smoking. No prior lung cancer screening or pulmonary function tests performed. - Order baseline pulmonary function test (PFT) - Enroll in lung cancer screening program - Schedule CT scan for lung cancer screening  Pneumonia Hospitalized in May for pneumonia with bilateral involvement and pleural effusion. Lungs currently clear on examination. Oxygen therapy initiated during hospitalization, now discontinued as oxygen levels are well-managed during ambulation. - Discontinue home oxygen therapy - Order removal of oxygen equipment from home      Advised if symptoms do not improve or worsen, to please contact office for sooner follow up or seek emergency care.    I spent 60 minutes of dedicated to the care of this patient on the date of this encounter to include pre-visit review of records, face-to-face time with the patient discussing conditions above, post visit ordering of testing, clinical documentation with the electronic health record, making appropriate referrals as documented, and communicating necessary findings to members of the patients care team.   C. Leita Sanders, MD Advanced Bronchoscopy PCCM [facility]    *This note was dictated using voice recognition software/Dragon.  Despite best efforts to proofread, errors can occur which can change the meaning. Any transcriptional errors that result from this process are unintentional and may not be fully corrected at the time of dictation.

## 2024-06-15 NOTE — Patient Instructions (Signed)
 VISIT SUMMARY:  Today, you had a follow-up appointment to evaluate your lung health after your recent hospitalization for pneumonia. We discussed your history of smoking, your recent symptoms, and your current health status.  YOUR PLAN:  -CHRONIC OBSTRUCTIVE PULMONARY DISEASE (COPD): COPD is a chronic lung disease that makes it hard to breathe. It is often caused by long-term exposure to irritating gases or particulate matter, most often from cigarette smoke. Since you have a history of smoking, we will start with a baseline pulmonary function test to assess your lung function. Additionally, we will enroll you in a lung cancer screening program and schedule a CT scan to check for any signs of lung cancer.  -PNEUMONIA: Pneumonia is an infection that inflames the air sacs in one or both lungs. You were hospitalized for pneumonia in May and have since recovered well. Your lungs are clear now, and your oxygen levels are stable, so we will discontinue your home oxygen therapy and arrange for the removal of the oxygen equipment from your home.  INSTRUCTIONS:  Please schedule your pulmonary function test and CT scan as soon as possible. We will also enroll you in a lung cancer screening program. If you experience any new or worsening symptoms, please contact our office immediately.

## 2024-06-18 ENCOUNTER — Encounter: Payer: Self-pay | Admitting: Pulmonary Disease

## 2024-06-18 DIAGNOSIS — J449 Chronic obstructive pulmonary disease, unspecified: Secondary | ICD-10-CM

## 2024-07-07 ENCOUNTER — Other Ambulatory Visit: Payer: Self-pay | Admitting: Cardiology

## 2024-07-07 DIAGNOSIS — I2510 Atherosclerotic heart disease of native coronary artery without angina pectoris: Secondary | ICD-10-CM

## 2024-07-07 DIAGNOSIS — I5020 Unspecified systolic (congestive) heart failure: Secondary | ICD-10-CM

## 2024-08-11 ENCOUNTER — Telehealth (HOSPITAL_COMMUNITY): Payer: Self-pay | Admitting: *Deleted

## 2024-08-11 NOTE — Telephone Encounter (Signed)
 Attempted to call patient regarding upcoming cardiac CT appointment. Left message on voicemail with name and callback number Sid Seats RN Navigator Cardiac Imaging [facility] [phone] Office

## 2024-08-11 NOTE — Telephone Encounter (Signed)
 Reaching out to patient to offer assistance regarding upcoming cardiac imaging study; pt verbalizes understanding of appt date/time, parking situation and where to check in, pre-test NPO status and medications ordered, and verified current allergies; name and call back number provided for further questions should they arise Sid Seats RN Navigator Cardiac Imaging [facility] [phone] office [phone] cell

## 2024-08-12 ENCOUNTER — Ambulatory Visit
Admission: RE | Admit: 2024-08-12 | Discharge: 2024-08-12 | Disposition: A | Discharge status: Home / Self Care | Source: Ambulatory Visit | Attending: Cardiology | Admitting: Cardiology

## 2024-08-12 DIAGNOSIS — I5020 Unspecified systolic (congestive) heart failure: Secondary | ICD-10-CM

## 2024-08-12 DIAGNOSIS — I2510 Atherosclerotic heart disease of native coronary artery without angina pectoris: Secondary | ICD-10-CM

## 2024-08-12 MED ORDER — IOHEXOL 350 MG/ML SOLN: 100.0000 mL | Freq: Once | INTRAVENOUS | Status: DC | PRN

## 2024-08-12 NOTE — Progress Notes (Signed)
 Patient presenting with low blood pressures, patient states this is her baseline for past few months. Heart rate 77-90s. Dr Wilburn notified. Patient instructed to stop taking Entresto  per MD and MDs office will reach out for follow up appointment. Dr. Wilburn office contacted patient during this visit offered to see patient Friday or Monday. Patient will call office back to confirm transportation for appointment.

## 2024-09-20 ENCOUNTER — Ambulatory Visit

## 2024-09-28 ENCOUNTER — Encounter (HOSPITAL_COMMUNITY): Payer: Self-pay

## 2024-09-30 ENCOUNTER — Ambulatory Visit
Admission: RE | Admit: 2024-09-30 | Discharge: 2024-09-30 | Disposition: A | Discharge status: Home / Self Care | Source: Ambulatory Visit | Attending: Cardiology | Admitting: Cardiology

## 2024-09-30 DIAGNOSIS — I5020 Unspecified systolic (congestive) heart failure: Secondary | ICD-10-CM | POA: Insufficient documentation

## 2024-09-30 DIAGNOSIS — I2510 Atherosclerotic heart disease of native coronary artery without angina pectoris: Secondary | ICD-10-CM | POA: Diagnosis present

## 2024-09-30 MED ORDER — IOHEXOL 350 MG/ML SOLN
100.0000 mL | Freq: Once | INTRAVENOUS | Status: AC | PRN
  Administered 2024-09-30: 100 mL via INTRAVENOUS

## 2024-09-30 MED ORDER — METOPROLOL TARTRATE 5 MG/5ML IV SOLN: 10.0000 mg | Freq: Once | INTRAVENOUS | Status: DC | PRN

## 2024-09-30 MED ORDER — NITROGLYCERIN 0.4 MG SL SUBL
0.8000 mg | SUBLINGUAL_TABLET | Freq: Once | SUBLINGUAL | Status: AC
  Administered 2024-09-30: 0.8 mg via SUBLINGUAL
  Filled 2024-09-30: qty 25

## 2024-09-30 MED ORDER — DILTIAZEM HCL 25 MG/5ML IV SOLN: 10.0000 mg | INTRAVENOUS | Status: DC | PRN

## 2024-09-30 MED ORDER — METOPROLOL TARTRATE 5 MG/5ML IV SOLN
INTRAVENOUS | Status: AC
  Filled 2024-09-30: qty 10

## 2024-09-30 NOTE — Progress Notes (Signed)
 Patient tolerated CT well. Vitals stable. Education to drink fluids throughout day provided. 122/70
